# Patient Record
Sex: Female | Born: 1935 | Race: White | Hispanic: No | Smoking: Never smoker
Health system: Southern US, Community
[De-identification: ages and names within clinical notes are randomized; demographics above are authoritative.]

## PROBLEM LIST (undated history)

## (undated) DIAGNOSIS — K219 Gastro-esophageal reflux disease without esophagitis: Secondary | ICD-10-CM

## (undated) DIAGNOSIS — J189 Pneumonia, unspecified organism: Secondary | ICD-10-CM

## (undated) DIAGNOSIS — E7800 Pure hypercholesterolemia, unspecified: Secondary | ICD-10-CM

## (undated) DIAGNOSIS — D649 Anemia, unspecified: Secondary | ICD-10-CM

## (undated) DIAGNOSIS — M810 Age-related osteoporosis without current pathological fracture: Secondary | ICD-10-CM

## (undated) DIAGNOSIS — F32A Depression, unspecified: Secondary | ICD-10-CM

## (undated) DIAGNOSIS — Z9889 Other specified postprocedural states: Secondary | ICD-10-CM

## (undated) DIAGNOSIS — F329 Major depressive disorder, single episode, unspecified: Secondary | ICD-10-CM

## (undated) DIAGNOSIS — R112 Nausea with vomiting, unspecified: Secondary | ICD-10-CM

## (undated) DIAGNOSIS — M1990 Unspecified osteoarthritis, unspecified site: Secondary | ICD-10-CM

## (undated) DIAGNOSIS — I10 Essential (primary) hypertension: Secondary | ICD-10-CM

## (undated) HISTORY — PX: TONSILLECTOMY: SUR1361

## (undated) HISTORY — PX: ABDOMINAL HYSTERECTOMY: SHX81

---

## 1991-12-17 HISTORY — PX: BREAST SURGERY: SHX581

## 2000-12-16 DIAGNOSIS — R55 Syncope and collapse: Secondary | ICD-10-CM

## 2000-12-16 HISTORY — DX: Syncope and collapse: R55

## 2009-03-28 ENCOUNTER — Ambulatory Visit: Payer: Self-pay | Admitting: Unknown Physician Specialty

## 2009-06-11 ENCOUNTER — Ambulatory Visit: Payer: Self-pay | Admitting: Internal Medicine

## 2009-12-21 ENCOUNTER — Ambulatory Visit: Payer: Self-pay | Admitting: Internal Medicine

## 2010-12-27 ENCOUNTER — Ambulatory Visit: Payer: Self-pay | Admitting: Internal Medicine

## 2011-10-13 ENCOUNTER — Ambulatory Visit: Payer: Self-pay | Admitting: Family Medicine

## 2011-12-17 HISTORY — PX: CATARACT EXTRACTION, BILATERAL: SHX1313

## 2011-12-30 ENCOUNTER — Ambulatory Visit: Payer: Self-pay | Admitting: Internal Medicine

## 2012-09-01 ENCOUNTER — Encounter: Payer: Self-pay | Admitting: Rheumatology

## 2012-09-15 ENCOUNTER — Encounter: Payer: Self-pay | Admitting: Rheumatology

## 2012-09-16 ENCOUNTER — Ambulatory Visit: Payer: Self-pay | Admitting: Ophthalmology

## 2012-10-25 ENCOUNTER — Ambulatory Visit: Payer: Self-pay | Admitting: Internal Medicine

## 2012-11-04 ENCOUNTER — Ambulatory Visit: Payer: Self-pay | Admitting: Ophthalmology

## 2012-12-30 ENCOUNTER — Ambulatory Visit: Payer: Self-pay | Admitting: Internal Medicine

## 2013-02-11 ENCOUNTER — Ambulatory Visit: Payer: Self-pay | Admitting: Unknown Physician Specialty

## 2013-12-02 ENCOUNTER — Ambulatory Visit: Payer: Self-pay | Admitting: General Practice

## 2014-02-02 ENCOUNTER — Ambulatory Visit: Payer: Self-pay | Admitting: General Practice

## 2014-02-02 LAB — BASIC METABOLIC PANEL
ANION GAP: 8 (ref 7–16)
BUN: 12 mg/dL (ref 7–18)
Calcium, Total: 9.9 mg/dL (ref 8.5–10.1)
Chloride: 101 mmol/L (ref 98–107)
Co2: 25 mmol/L (ref 21–32)
Creatinine: 0.71 mg/dL (ref 0.60–1.30)
EGFR (African American): >60
GLUCOSE: 78 mg/dL (ref 65–99)
Osmolality: 267 (ref 275–301)
Potassium: 3.3 mmol/L — ABNORMAL LOW (ref 3.5–5.1)
Sodium: 134 mmol/L — ABNORMAL LOW (ref 136–145)

## 2014-02-02 LAB — CBC
HCT: 33.7 % — ABNORMAL LOW (ref 35.0–47.0)
HGB: 11.5 g/dL — ABNORMAL LOW (ref 12.0–16.0)
MCH: 29.1 pg (ref 26.0–34.0)
MCHC: 34.2 g/dL (ref 32.0–36.0)
MCV: 85 fL (ref 80–100)
Platelet: 307 10*3/uL (ref 150–440)
RBC: 3.95 10*6/uL (ref 3.80–5.20)
RDW: 14.1 % (ref 11.5–14.5)
WBC: 8.4 10*3/uL (ref 3.6–11.0)

## 2014-02-02 LAB — URINALYSIS, COMPLETE
BILIRUBIN, UR: NEGATIVE
BLOOD: NEGATIVE
Bacteria: NONE SEEN
Glucose,UR: NEGATIVE mg/dL (ref 0–75)
KETONE: NEGATIVE
Leukocyte Esterase: NEGATIVE
Nitrite: NEGATIVE
PH: 8 (ref 4.5–8.0)
PROTEIN: NEGATIVE
RBC,UR: NONE SEEN /HPF (ref 0–5)
SPECIFIC GRAVITY: 1.005 (ref 1.003–1.030)
Squamous Epithelial: <1
WBC UR: NONE SEEN /HPF (ref 0–5)

## 2014-02-02 LAB — PROTIME-INR
INR: 0.9
Prothrombin Time: 12.4 s

## 2014-02-02 LAB — SEDIMENTATION RATE: Erythrocyte Sed Rate: 33 mm/h — ABNORMAL HIGH

## 2014-02-02 LAB — APTT: ACTIVATED PTT: 32.1 s (ref 23.6–35.9)

## 2014-02-02 LAB — MRSA PCR SCREENING

## 2014-02-04 LAB — URINE CULTURE

## 2014-02-16 ENCOUNTER — Inpatient Hospital Stay: Payer: Self-pay | Admitting: General Practice

## 2014-02-16 HISTORY — PX: OTHER SURGICAL HISTORY: SHX169

## 2014-02-17 LAB — BASIC METABOLIC PANEL
Anion Gap: 4 — ABNORMAL LOW (ref 7–16)
BUN: 8 mg/dL (ref 7–18)
CREATININE: 0.66 mg/dL (ref 0.60–1.30)
Calcium, Total: 8.5 mg/dL (ref 8.5–10.1)
Chloride: 103 mmol/L (ref 98–107)
Co2: 28 mmol/L (ref 21–32)
EGFR (Non-African Amer.): >60
Glucose: 120 mg/dL — ABNORMAL HIGH (ref 65–99)
Osmolality: 270 (ref 275–301)
Potassium: 3.7 mmol/L (ref 3.5–5.1)
Sodium: 135 mmol/L — ABNORMAL LOW (ref 136–145)

## 2014-02-17 LAB — PLATELET COUNT: PLATELETS: 232 10*3/uL (ref 150–440)

## 2014-02-17 LAB — HEMOGLOBIN: HGB: 9.2 g/dL — AB (ref 12.0–16.0)

## 2014-02-18 ENCOUNTER — Encounter: Payer: Self-pay | Admitting: Internal Medicine

## 2014-02-18 LAB — BASIC METABOLIC PANEL
Anion Gap: 3 — ABNORMAL LOW (ref 7–16)
BUN: 10 mg/dL (ref 7–18)
Calcium, Total: 8.2 mg/dL — ABNORMAL LOW (ref 8.5–10.1)
Chloride: 106 mmol/L (ref 98–107)
Co2: 26 mmol/L (ref 21–32)
Creatinine: 0.5 mg/dL — ABNORMAL LOW (ref 0.60–1.30)
EGFR (African American): >60
Glucose: 130 mg/dL — ABNORMAL HIGH (ref 65–99)
Osmolality: 271 (ref 275–301)
Potassium: 4 mmol/L (ref 3.5–5.1)
SODIUM: 135 mmol/L — AB (ref 136–145)

## 2014-02-18 LAB — PLATELET COUNT: Platelet: 223 10*3/uL (ref 150–440)

## 2014-02-18 LAB — HEMOGLOBIN: HGB: 8.2 g/dL — ABNORMAL LOW (ref 12.0–16.0)

## 2014-02-18 LAB — PATHOLOGY REPORT

## 2014-02-19 LAB — HEMOGLOBIN: HGB: 8.1 g/dL — ABNORMAL LOW (ref 12.0–16.0)

## 2014-05-04 DIAGNOSIS — Z96641 Presence of right artificial hip joint: Secondary | ICD-10-CM | POA: Insufficient documentation

## 2014-06-06 DIAGNOSIS — I10 Essential (primary) hypertension: Secondary | ICD-10-CM | POA: Insufficient documentation

## 2014-06-06 DIAGNOSIS — M8580 Other specified disorders of bone density and structure, unspecified site: Secondary | ICD-10-CM | POA: Insufficient documentation

## 2014-09-22 ENCOUNTER — Ambulatory Visit: Payer: Self-pay

## 2014-09-22 LAB — URINALYSIS, COMPLETE
BILIRUBIN, UR: NEGATIVE
Bacteria: NEGATIVE
Blood: NEGATIVE
Glucose,UR: NEGATIVE
Ketone: NEGATIVE
Leukocyte Esterase: NEGATIVE
Nitrite: NEGATIVE
PH: 7.5 (ref 5.0–8.0)
PROTEIN: NEGATIVE
RBC,UR: NONE SEEN /HPF (ref 0–5)
Specific Gravity: 1.015 (ref 1.000–1.030)
WBC UR: NONE SEEN /HPF (ref 0–5)

## 2014-09-22 LAB — COMPREHENSIVE METABOLIC PANEL
ALBUMIN: 3.9 g/dL (ref 3.4–5.0)
ALK PHOS: 77 U/L
ALT: 26 U/L
Anion Gap: 11 (ref 7–16)
BUN: 13 mg/dL (ref 7–18)
Bilirubin,Total: 0.7 mg/dL (ref 0.2–1.0)
CHLORIDE: 95 mmol/L — AB (ref 98–107)
CREATININE: 0.71 mg/dL (ref 0.60–1.30)
Calcium, Total: 9.7 mg/dL (ref 8.5–10.1)
Co2: 24 mmol/L (ref 21–32)
EGFR (African American): >60
EGFR (Non-African Amer.): >60
Glucose: 91 mg/dL (ref 65–99)
Osmolality: 260 (ref 275–301)
POTASSIUM: 3 mmol/L — AB (ref 3.5–5.1)
SGOT(AST): 31 U/L (ref 15–37)
Sodium: 130 mmol/L — ABNORMAL LOW (ref 136–145)
TOTAL PROTEIN: 7.5 g/dL (ref 6.4–8.2)

## 2014-09-22 LAB — CBC WITH DIFFERENTIAL/PLATELET
BASOS PCT: 1 %
Basophil #: 0.1 10*3/uL (ref 0.0–0.1)
Eosinophil #: 0.2 10*3/uL (ref 0.0–0.7)
Eosinophil %: 1.8 %
HCT: 35.9 % (ref 35.0–47.0)
HGB: 12.1 g/dL (ref 12.0–16.0)
LYMPHS ABS: 2.1 10*3/uL (ref 1.0–3.6)
Lymphocyte %: 23.4 %
MCH: 28.6 pg (ref 26.0–34.0)
MCHC: 33.6 g/dL (ref 32.0–36.0)
MCV: 85 fL (ref 80–100)
Monocyte #: 0.7 x10 3/mm (ref 0.2–0.9)
Monocyte %: 8 %
Neutrophil #: 5.9 10*3/uL (ref 1.4–6.5)
Neutrophil %: 65.8 %
PLATELETS: 298 10*3/uL (ref 150–440)
RBC: 4.22 10*6/uL (ref 3.80–5.20)
RDW: 14.3 % (ref 11.5–14.5)
WBC: 8.9 10*3/uL (ref 3.6–11.0)

## 2014-09-22 LAB — LIPASE, BLOOD: Lipase: 127 U/L (ref 73–393)

## 2014-09-22 LAB — AMYLASE: Amylase: 38 U/L (ref 25–115)

## 2014-12-12 DIAGNOSIS — F419 Anxiety disorder, unspecified: Secondary | ICD-10-CM | POA: Insufficient documentation

## 2015-04-08 NOTE — Discharge Summary (Signed)
PATIENT NAME:  Susan Ayala, Susan Ayala MR#:  [id number] DATE OF BIRTH:  11-25-1934  DATE OF ADMISSION:  02/16/2014 DATE OF DISCHARGE:  02/19/2014   DICTATING FOR: Jeneen Rinks P. Holley Bouche., MD  ADMITTING DIAGNOSIS: Degenerative arthrosis of right hip.   DISCHARGE DIAGNOSIS:  Degenerative arthrosis of right hip.   HISTORY OF PRESENT ILLNESS: The patient is a 81 year old who has been followed at [facility] for progression of right hip and groin pain. She had reported no apparent trauma or aggravating event. She had reported crepitus with range of motion of the right hip and had also noticed some decrease in her range of motion. At the time of surgery, she had gone to using a cane for ambulation due to the severity of pain. The patient had also received intra-articular cortisone injections to the hip in the past without any significant improvement in her symptoms. She states that she has not seen any significant improvement in her condition despite the use of nonsteroidal anti-inflammatory medications as well as activity modification. She stated that the right hip and groin pain had progressed to the point that it was significantly interfering with her activities of daily living. X-rays taken in [facility] showed severe degenerative changes to the right hip with collapse of the superior aspect of the femoral head. After discussion of the risks and benefits of surgical intervention, the patient expressed her understanding of the risks and benefits and agreed for plans for surgical intervention.   PROCEDURE: Right total hip arthroplasty.   ANESTHESIA: Spinal.   IMPLANTS UTILIZED: DePuy 13.5 mm small stature AML femoral stem, 54 mm outer diameter Pinnacle Gription sector acetabular component, +4 mm neutral Pinnacle Marathon polyethylene liner, 36 mm outer diameter M-Spec femoral head with a +1.5 mm neck length, 6.5 x 25 mm Pinnacle cancellous bone screw and a 6.5 x 15 mm Pinnacle cancellous bone  screw.   HOSPITAL COURSE: The patient tolerated the procedure very well. She had no complications. She was then taken to the PACU, where she was stabilized and then transferred to the orthopedic floor. The patient began receiving anticoagulation therapy of Lovenox 30 mg subcutaneous q.12 hours per anesthesia and pharmacy protocol. She was fitted with TED stockings bilaterally. These were allowed to be removed 1 hour per 8-hour shift. The patient was also fitted with AVI compression foot pumps bilaterally set at 80 mmHg. Her calves have been nontender. There has been no evidence of any DVTs. Negative Homans sign. Heels were elevated off the bed using rolled towels.   The patient has denied any chest pain or shortness of breath. She did have some nausea initially. Her medication was then changed to [place]o. Vital signs have been stable. She has been afebrile. Hemodynamically, she was stable, and no transfusions were given.   Physical therapy was initiated on day 1 for gait training and transfers. She has done very well. Upon being discharged, was ambulating greater than 200 feet, was independent with bed to chair transfers. Occupational therapy was also initiated on day 1 for ADLs and assistive devices.   The patient's IV, Foley and Hemovac were discontinued on day 2 along with a dressing change. The wound was free of any drainage or signs of infection. She did have a small skin tear due to the tape that was placed on at the time of surgery, and this was covered.   The patient is being discharged to a skilled nursing facility in improved stable condition.   DISCHARGE INSTRUCTIONS:  1. She  is to continue weightbearing as tolerated.  2. She has gone over the posterior hip precautions.  3. She is to elevate the lower extremity.  4. Continue with TED stockings. These are allowed to be removed 1 hour per 8-hour shift.  5. She is to continue with the incentive spirometer q.1 hour while awake. Encourage the  patient to do cough and deep breathing q.2 hours while awake.  6. She is placed on a regular diet.  7. The dressing will need to be changed as needed. She is not to get the dressing wet until the staples are removed. The staples will be removed on March 18, followed by the application of benzoin and Steri-Strips.  8. She has a followup appointment in [facility] on April 21st at 2:00. She is to call the clinic sooner if any problems.   DRUG ALLERGIES: CODEINE, ADHESIVES, BAND-AIDS,    MEDICATIONS:  1. Calcium carbonate 500 mg b.i.d. 2. Calcium citrate 1 tablet b.i.d.  3. Vitamin D3 1000 units daily.  4. Senokot-S 1 tablet b.i.d. 5. Hydrochlorothiazide 25 mg at bedtime. 6. Mevacor 40 mg at bedtime.  7. One multivitamin tablet per day.  8. Pantoprazole 40 mg b.i.d. 9. Zoloft 100 mg daily. 10. Lovenox 30 mg subcutaneous q.12 hours for 14 days, then discontinue and begin taking one 81 mg enteric-coated aspirin per day unless contraindicated.  11. Tylenol ES 500 to 1000 mg q.4-6 hours p.r.n. for pain and fevers of 100.4 or greater. 12. Mylanta DS 30 mL q.6 hours.  13. Dulcolax suppositories 10 mg rectally daily p.r.n. for constipation if no results with Milk of Magnesia.  14. Claritin 10 mg daily p.r.n. 15. Milk of Magnesia 30 mL b.i.d. p.r.n. 16. Nucynta 50 to 100 mg q.4-6 hours p.r.n. for pain. 17. Ultram 50 to 100 mg q.4-6 hours p.r.n. for pain.   PAST MEDICAL HISTORY:  1. Varicella. 2. Fibrocystic breast disease.  3. Depression.  4. Arthritis.  5. Hyperlipidemia.  6. Osteoporosis.  7. Facial skin cancer.   ____________________________ Vance Peper, PA jrw:lb D: 02/19/2014 08:01:58 ET T: 02/19/2014 08:26:03 ET JOB#: [id number]  cc: Vance Peper, PA, <Dictator> JON WOLFE PA ELECTRONICALLY SIGNED 03/03/2014 18:32

## 2015-04-08 NOTE — Op Note (Signed)
PATIENT NAME:  Susan Ayala, Susan Ayala MR#:  [id number] DATE OF BIRTH:  February 02, 1934  DATE OF PROCEDURE:  02/16/2014  PREOPERATIVE DIAGNOSIS: Degenerative arthrosis of the right hip.   POSTOPERATIVE DIAGNOSIS: Degenerative arthrosis of the right hip.   PROCEDURE PERFORMED: Right total hip arthroplasty.   SURGEON: Laurice Record. Hooten, M.D.   ASSISTANT: Vance Peper, PA (required to maintain retraction throughout the procedure).   ANESTHESIA: Spinal.   ESTIMATED BLOOD LOSS: 50 mL.   FLUIDS REPLACED: 1800 mL of crystalloid.   DRAINS: Two medium drains to Hemovac reservoir.   IMPLANTS UTILIZED: DePuy 13.5 mm small stature AML femoral stem, 54 mm outer diameter Pinnacle Gription Sector acetabular component, +4 mm neutral Pinnacle Marathon polyethylene liner, 36 mm outer diameter M-SPEC femoral head with a +1.5 mm neck length, 6.5 x 25 mm Pinnacle cancellous bone screw, 6.5 x 15 mm Pinnacle cancellous bone screw.   INDICATIONS FOR SURGERY: The patient is a 81 year old female who has been seen for complaints of progressive right hip and groin pain. X-rays demonstrated severe degenerative changes with essentially collapse of the femoral head. After discussion of the risks and benefits of surgical intervention, the patient expressed understanding of the risks and benefits and agreed with plans for surgical intervention.   PROCEDURE IN DETAIL: The patient was brought to the operating room and, after adequate spinal anesthesia was achieved, the patient was placed in the left lateral decubitus position. Axillary roll was placed and all bony prominences were well padded. The patient's right hip and leg were cleaned and prepped with alcohol and DuraPrep and draped in the usual sterile fashion. A "timeout" was performed as per usual protocol.   A lateral curvilinear incision was made gently curving towards the posterior superior iliac spine. IT band was incised in line with the skin incision, and the fibers of the  gluteus maximus were split in line. Piriformis tendon was identified, skeletonized, and incised at its insertion at the proximal femur and reflected posteriorly.   In a similar fashion, the short external rotators were incised and reflected posteriorly. T-type posterior capsulotomy was performed. Moderate joint effusion was evacuated. Significant synovitis was encountered and synovial tissue was debrided. Prior to dislocation of the femoral head, a threaded Steinmann pin was inserted through a separate stab incision into the pelvis superior to the acetabulum and bent in the form of a stylus so as to assess limb length and hip offset throughout the procedure. The femoral head was then dislocated posteriorly.   Severe erosive changes were noted with full-thickness loss of articular cartilage. The femoral neck cut was performed using an oscillating saw. The anterior capsule was elevated off of the femoral neck. Inspection of the acetabulum also demonstrated significant degenerative changes with notable thinning along the superior and posterior margins. A remnant of the labrum was excised. The acetabulum was then reamed in a sequential fashion up to a 53 mm diameter. Good punctate bleeding bone was encountered.   A 54 mm outer diameter Pinnacle Gription Sector acetabular component was positioned and impacted into place. Fair to good scratch fit was appreciated. It was elected to provide additional stabilization with insertion of 2 dome screws. A 25 x 6.5 mm cancellous screw and a 15 x 6.5 mm cancellous screw were inserted with good bony purchase appreciated. A+ 4 neutral polyethylene trial was inserted and attention was directed to the proximal femur. Pilot hole for reaming of the proximal femoral canal was created using a high-speed bur. Proximal femoral canal was reamed in  a sequential fashion up to a 13 mm diameter. This allowed for approximately 5.5 cm scratch fit.   The proximal femur was prepared using a  13.5 mm aggressive side-biting reamer. Serial broaches were inserted up to a 13.5 mm small stature L-broach. The calcar region was planed and trial reduction was performed with a 36 mm hip ball with a +1.5 mm neck length. The improved hip offset and improved equalization of limb lengths was appreciated. Excellent stability was noted both anteriorly and posteriorly.   The femoral head was then dislocated posteriorly. Polyethylene trial was removed. The acetabular shell was irrigated with normal saline with antibiotic solution and then suctioned dry. A+ 4 mm neutral Pinnacle Marathon polyethylene liner was positioned and impacted into place. Next, a 13.5 mm small-stature AML femoral component was positioned and impacted into place. Good scratch fit was appreciated.   Trial reduction was again performed with a 36 mm hip ball with a +1.5 mm neck length. Again, improved hip offset and good equalization of limb lengths was appreciated. Excellent stability was noted both anteriorly and posteriorly. The hip was dislocated posteriorly and the Morse tape  was cleaned and dried. A 36 mm M-SPEC femoral head with a + 1.5 mm neck length was placed on the trunnion and impacted into place. The hip was reduced and placed through a range of motion. Excellent stability was appreciated and good equalization of limb lengths are noted.   The wound was irrigated with copious amounts of normal saline with antibiotic solution using pulsatile lavage and then suctioned dry. Good hemostasis was appreciated. The T-type posterior capsulotomy was repaired using #5 Ethibond. The piriformis tendon was reapproximated on the surface of the gluteus medius tendon using #5 Ethibond. Two medium drains were placed in the wound bed and brought out through a separate stab incision to be attached to a Hemovac reservoir. The IT band was repaired using interrupted sutures of #1 Vicryl. The subcutaneous tissue was approximated in layers using first #0  Vicryl followed by #2-0 Vicryl. Then 0.25% Marcaine with epinephrine was injected along the incision site. Skin was then closed with skin staples. A sterile dressing was applied.   The patient tolerated the procedure well. She was transported to the recovery room in stable condition.  ____________________________ Laurice Record. Holley Bouche., MD jph:np D: 02/16/2014 10:49:29 ET T: 02/16/2014 18:58:19 ET JOB#: [id number]  cc: Jeneen Rinks P. Holley Bouche., MD, <Dictator> JAMES P Holley Bouche MD ELECTRONICALLY SIGNED 02/18/2014 13:05

## 2015-06-14 DIAGNOSIS — J302 Other seasonal allergic rhinitis: Secondary | ICD-10-CM | POA: Insufficient documentation

## 2015-06-14 DIAGNOSIS — Z79899 Other long term (current) drug therapy: Secondary | ICD-10-CM | POA: Insufficient documentation

## 2016-05-24 ENCOUNTER — Other Ambulatory Visit: Payer: Self-pay | Admitting: Orthopedic Surgery

## 2016-05-24 DIAGNOSIS — M25561 Pain in right knee: Principal | ICD-10-CM

## 2016-05-24 DIAGNOSIS — G8929 Other chronic pain: Secondary | ICD-10-CM

## 2016-06-13 ENCOUNTER — Ambulatory Visit: Admission: RE | Admit: 2016-06-13 | Payer: Medicare Other | Source: Ambulatory Visit

## 2016-06-19 ENCOUNTER — Ambulatory Visit
Admission: RE | Admit: 2016-06-19 | Discharge: 2016-06-19 | Disposition: A | Discharge status: Home / Self Care | Payer: Medicare Other | Source: Ambulatory Visit | Attending: Orthopedic Surgery | Admitting: Orthopedic Surgery

## 2016-06-19 DIAGNOSIS — X58XXXA Exposure to other specified factors, initial encounter: Secondary | ICD-10-CM | POA: Insufficient documentation

## 2016-06-19 DIAGNOSIS — M179 Osteoarthritis of knee, unspecified: Secondary | ICD-10-CM | POA: Insufficient documentation

## 2016-06-19 DIAGNOSIS — M25561 Pain in right knee: Secondary | ICD-10-CM | POA: Diagnosis present

## 2016-06-19 DIAGNOSIS — S83281A Other tear of lateral meniscus, current injury, right knee, initial encounter: Secondary | ICD-10-CM | POA: Diagnosis not present

## 2016-06-19 DIAGNOSIS — G8929 Other chronic pain: Secondary | ICD-10-CM

## 2016-06-19 DIAGNOSIS — M659 Synovitis and tenosynovitis, unspecified: Secondary | ICD-10-CM | POA: Insufficient documentation

## 2016-06-24 IMAGING — CT CT ABD-PELV W/O CM
3 of 4 series · 9 of 46 positions shown, 16 images · non-contrast
Comparison: None.

CLINICAL DATA: Right lower quadrant pain.  Initial evaluation.

EXAM:
CT ABDOMEN AND PELVIS WITHOUT CONTRAST
TECHNIQUE: Multidetector CT imaging of the abdomen and pelvis was performed
following the standard protocol without IV contrast.

[Series 4: lung · axial · 0.80mm/px · z∈[-481,-406]mm · 5 of 23 slices shown, 10 images]
[im 4/23  soft-tissue]
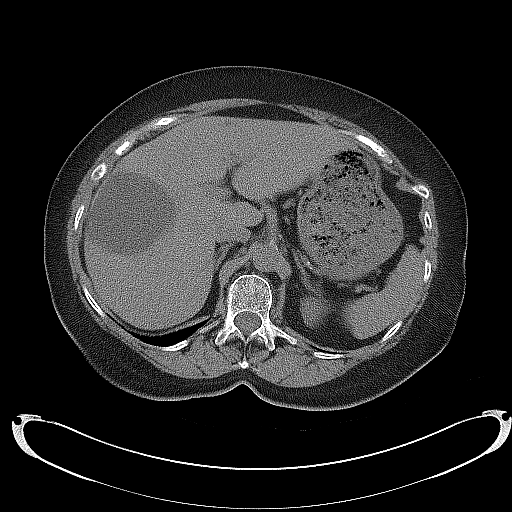
[im 4/23  bone]
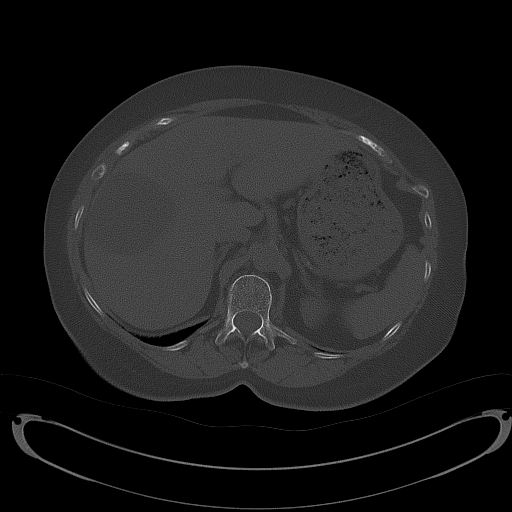
[im 8/23  soft-tissue]
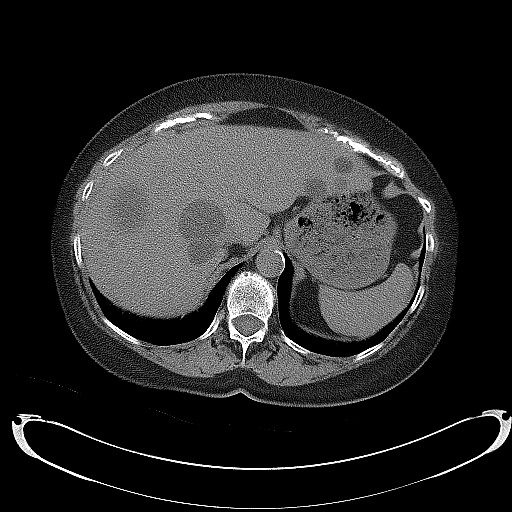
[im 8/23  lung]
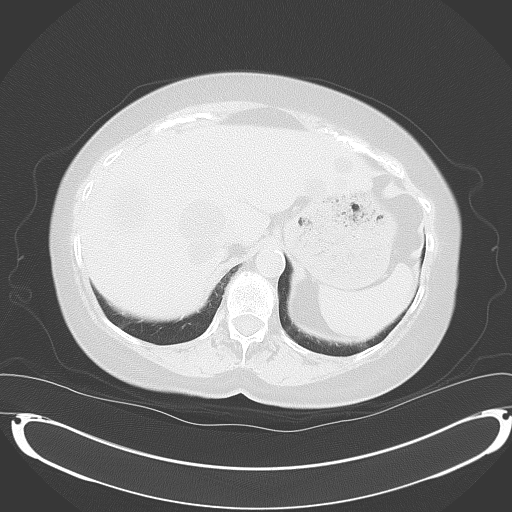
[im 12/23  soft-tissue]
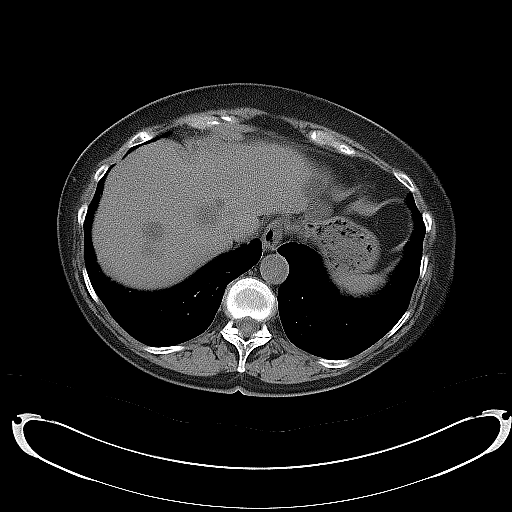
[im 12/23  lung]
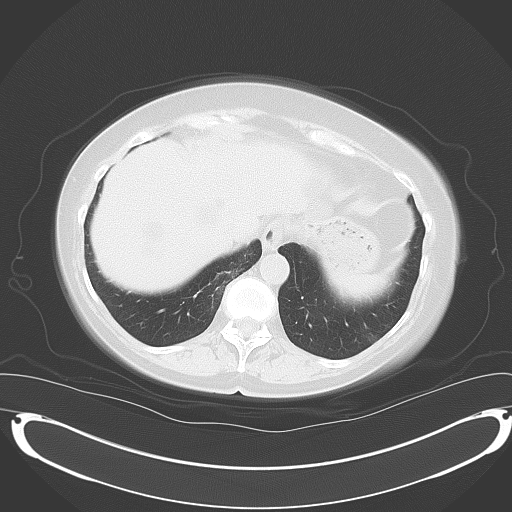
[im 15/23  soft-tissue]
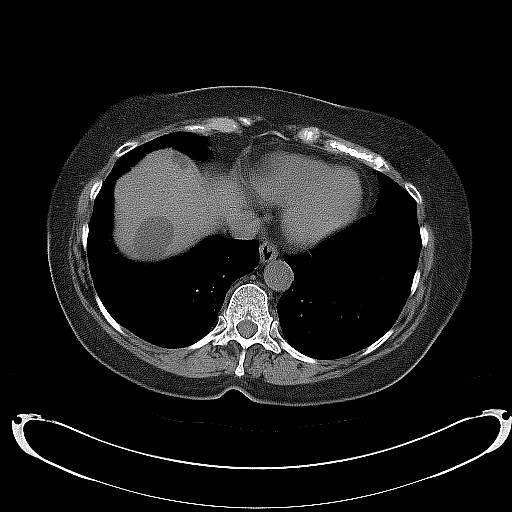
[im 15/23  lung]
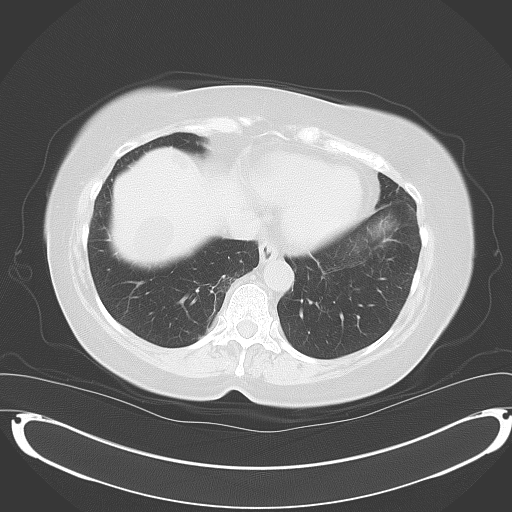
[im 19/23  soft-tissue]
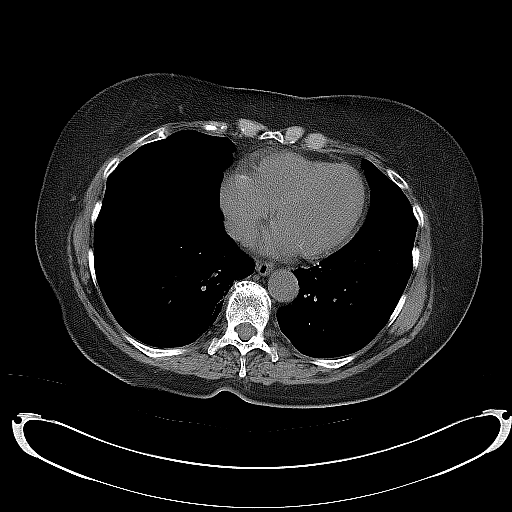
[im 19/23  lung]
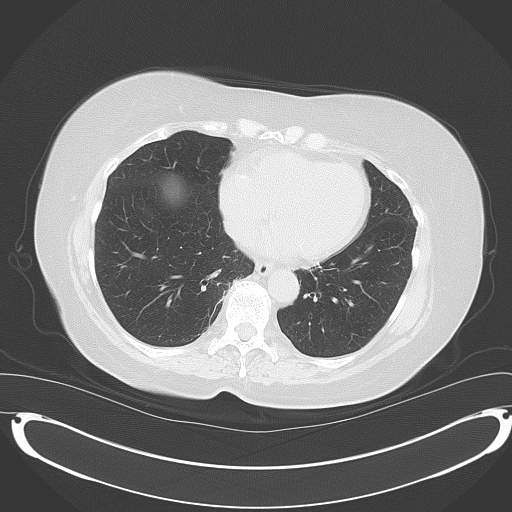

[Series 5: coronal · coronal · 0.88mm/px · 3 of 134 slices shown, 4 images]
[im 45/134  soft-tissue]
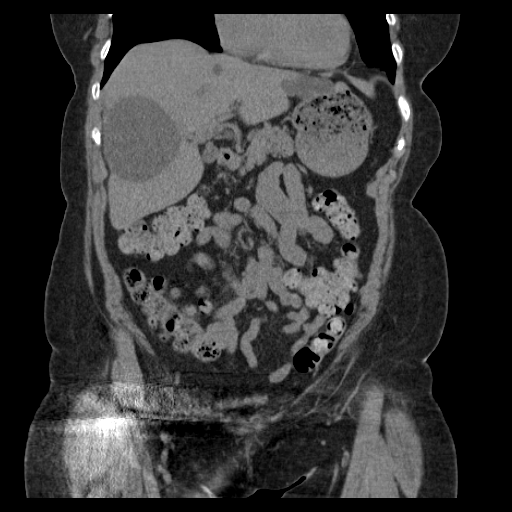
[im 60/134  soft-tissue]
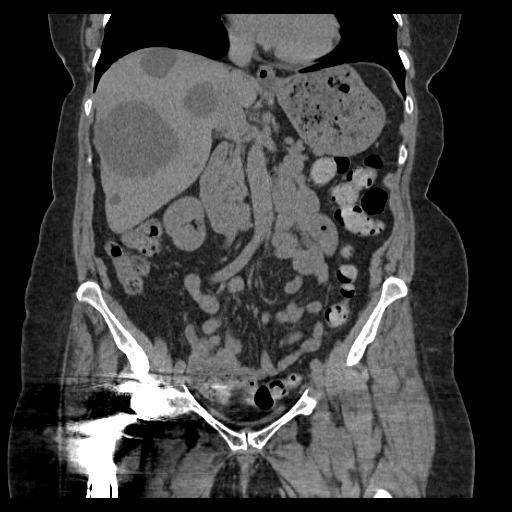
[im 60/134  bone]
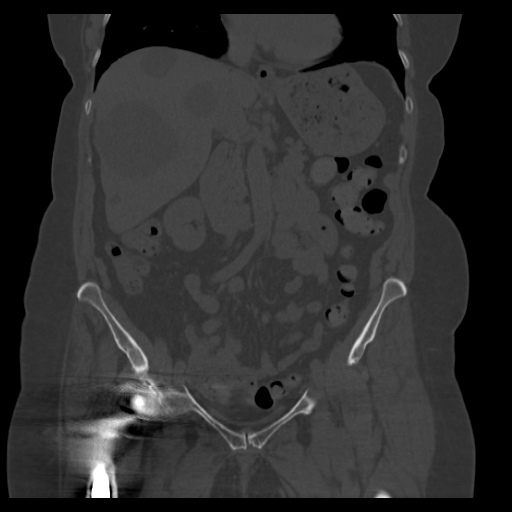
[im 74/134  soft-tissue]
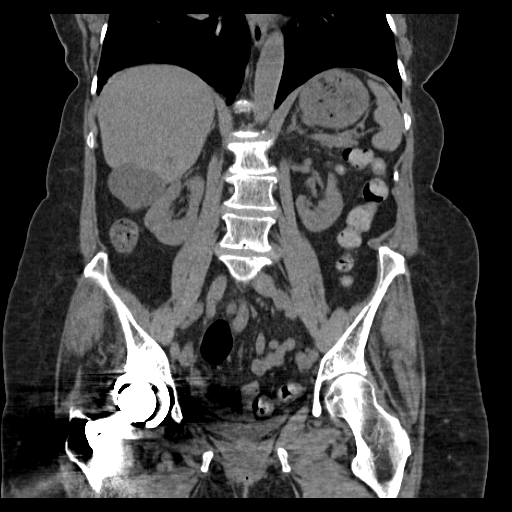

[Series 6: sagittal · sagittal · 0.59mm/px · 1 of 196 slices shown, 2 images]
[im 66/196  soft-tissue]
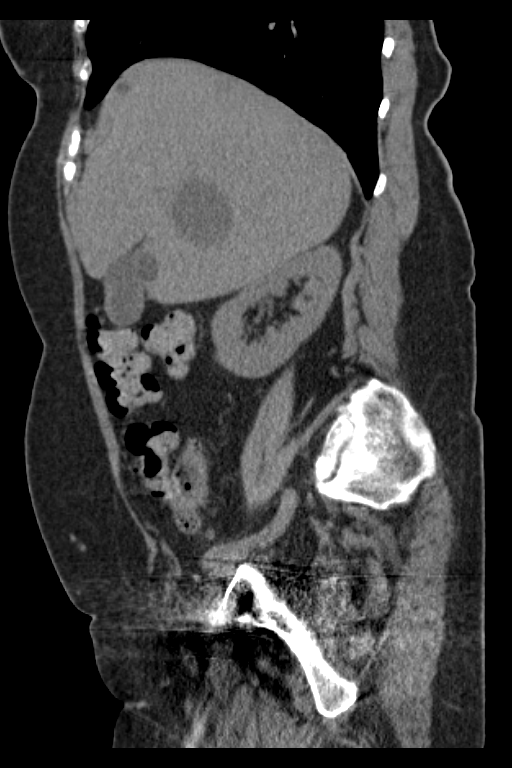
[im 66/196  bone]
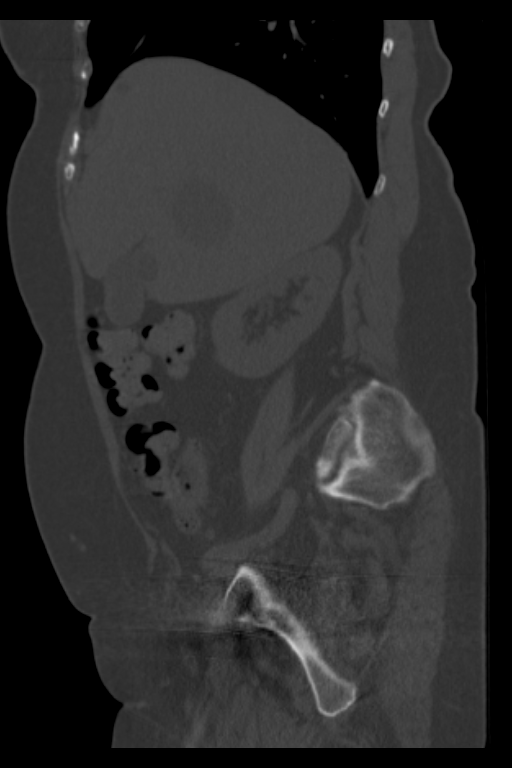

[9 of 46 positions shown; findings below may reference images not displayed]

FINDINGS: Innumerable hepatic lucencies are noted consistent simple cysts. The
largest is in the right lobe of liver measures 8.3 cm in maximum
diameter. Spleen normal. Pancreas normal. No biliary distention. The
gallbladder is nondistended.

Adrenals are unremarkable. No focal renal abnormality. No
obstructing ureteral stone. The bladder nondistended.

No significant adenopathy.  Abdominal aorta normal in caliber.

Appendix not visualized. Stool is noted throughout the colon. No
free air. No mesenteric mass. Bilateral inguinal hernias. Small
umbilical hernia.

Coronary artery disease. Heart size normal. No focal pulmonary
infiltrates. Degenerative changes thoracolumbar spine. Right hip
replacement.
IMPRESSION: 1. Innumerable hepatic lucencies consistent with simple cysts.
2. No focal acute abnormality. Right lower quadrant unremarkable. No
inflammatory changes are noted. Appendix not identified.
3. Coronary artery disease.

## 2016-08-15 ENCOUNTER — Encounter
Admission: RE | Admit: 2016-08-15 | Discharge: 2016-08-15 | Disposition: A | Discharge status: Home / Self Care | Payer: Medicare Other | Source: Ambulatory Visit | Attending: Orthopedic Surgery | Admitting: Orthopedic Surgery

## 2016-08-15 DIAGNOSIS — Z01810 Encounter for preprocedural cardiovascular examination: Secondary | ICD-10-CM | POA: Diagnosis not present

## 2016-08-15 DIAGNOSIS — I10 Essential (primary) hypertension: Secondary | ICD-10-CM

## 2016-08-15 DIAGNOSIS — Z01812 Encounter for preprocedural laboratory examination: Secondary | ICD-10-CM | POA: Insufficient documentation

## 2016-08-15 HISTORY — DX: Major depressive disorder, single episode, unspecified: F32.9

## 2016-08-15 HISTORY — DX: Pure hypercholesterolemia, unspecified: E78.00

## 2016-08-15 HISTORY — DX: Age-related osteoporosis without current pathological fracture: M81.0

## 2016-08-15 HISTORY — DX: Nausea with vomiting, unspecified: R11.2

## 2016-08-15 HISTORY — DX: Other specified postprocedural states: Z98.890

## 2016-08-15 HISTORY — DX: Essential (primary) hypertension: I10

## 2016-08-15 HISTORY — DX: Unspecified osteoarthritis, unspecified site: M19.90

## 2016-08-15 HISTORY — DX: Depression, unspecified: F32.A

## 2016-08-15 LAB — BASIC METABOLIC PANEL
ANION GAP: 7 (ref 5–15)
BUN: 16 mg/dL (ref 6–20)
CHLORIDE: 102 mmol/L (ref 101–111)
CO2: 28 mmol/L (ref 22–32)
CREATININE: 0.58 mg/dL (ref 0.44–1.00)
Calcium: 10 mg/dL (ref 8.9–10.3)
GFR calc non Af Amer: >60 mL/min (ref >60)
GLUCOSE: 100 mg/dL — AB (ref 65–99)
POTASSIUM: 3.8 mmol/L (ref 3.5–5.1)
SODIUM: 137 mmol/L (ref 135–145)

## 2016-08-15 NOTE — Patient Instructions (Signed)
  Your procedure is scheduled on: Monday Sept. 11 , 2017. Report to Same Day Surgery. To find out your arrival time please call [phone] between 1PM - 3PM on Friday Sept. 8, 2017.  Remember: Instructions that are not followed completely may result in serious medical risk, up to and including death, or upon the discretion of your surgeon and anesthesiologist your surgery may need to be rescheduled.    _x___ 1. Do not eat food or drink liquids after midnight. No gum chewing or hard candies.     _x___ 2. No Alcohol for 24 hours before or after surgery.   ____ 3. Bring all medications with you on the day of surgery if instructed.    __x__ 4. Notify your doctor if there is any change in your medical condition     (cold, fever, infections).    _____ 5. No smoking 24 hours prior to surgery.     Do not wear jewelry, make-up, hairpins, clips or nail polish.  Do not wear lotions, powders, or perfumes.   Do not shave 48 hours prior to surgery. Men may shave face and neck.  Do not bring valuables to the hospital.    [facility] is not responsible for any belongings or valuables.               Contacts, dentures or bridgework may not be worn into surgery.  Leave your suitcase in the car. After surgery it may be brought to your room.  For patients admitted to the hospital, discharge time is determined by your treatment team.   Patients discharged the day of surgery will not be allowed to drive home.    Please read over the following fact sheets that you were given:   [facility] Preparing for Surgery  __x_ Take these medicines the morning of surgery with A SIP OF WATER:    1. sertraline (ZOLOFT)     ____ Fleet Enema (as directed)   _x___ Use CHG Soap as directed on instruction sheet  ____ Use inhalers on the day of surgery and bring to hospital day of surgery  ____ Stop metformin 2 days prior to surgery    ____ Take 1/2 of usual insulin dose the night before surgery and none on  the morning of surgery.   _x___ Stop aspirin 7 days prior to surgery.  _x___ Stop Anti-inflammatories such as Advil, Aleve, Ibuprofen, Motrin, Naproxen, Naprosyn, Goodies powders or aspirin products. OK to take Tylenol.   ____ Stop supplements until after surgery.    ____ Bring C-Pap to the hospital.

## 2016-08-26 ENCOUNTER — Ambulatory Visit: Payer: Medicare Other | Admitting: Certified Registered Nurse Anesthetist

## 2016-08-26 ENCOUNTER — Encounter
Admission: RE | Disposition: A | Discharge status: Home / Self Care | Payer: Self-pay | Source: Ambulatory Visit | Attending: Orthopedic Surgery

## 2016-08-26 ENCOUNTER — Ambulatory Visit
Admission: RE | Admit: 2016-08-26 | Discharge: 2016-08-26 | Disposition: A | Discharge status: Home / Self Care | Payer: Medicare Other | Source: Ambulatory Visit | Attending: Orthopedic Surgery | Admitting: Orthopedic Surgery

## 2016-08-26 DIAGNOSIS — Z800 Family history of malignant neoplasm of digestive organs: Secondary | ICD-10-CM | POA: Insufficient documentation

## 2016-08-26 DIAGNOSIS — M2391 Unspecified internal derangement of right knee: Secondary | ICD-10-CM | POA: Diagnosis present

## 2016-08-26 DIAGNOSIS — Z801 Family history of malignant neoplasm of trachea, bronchus and lung: Secondary | ICD-10-CM | POA: Insufficient documentation

## 2016-08-26 DIAGNOSIS — Z8261 Family history of arthritis: Secondary | ICD-10-CM | POA: Diagnosis not present

## 2016-08-26 DIAGNOSIS — M94261 Chondromalacia, right knee: Secondary | ICD-10-CM | POA: Insufficient documentation

## 2016-08-26 DIAGNOSIS — M1990 Unspecified osteoarthritis, unspecified site: Secondary | ICD-10-CM | POA: Insufficient documentation

## 2016-08-26 DIAGNOSIS — N6019 Diffuse cystic mastopathy of unspecified breast: Secondary | ICD-10-CM | POA: Diagnosis not present

## 2016-08-26 DIAGNOSIS — Z885 Allergy status to narcotic agent status: Secondary | ICD-10-CM | POA: Insufficient documentation

## 2016-08-26 DIAGNOSIS — I10 Essential (primary) hypertension: Secondary | ICD-10-CM | POA: Insufficient documentation

## 2016-08-26 DIAGNOSIS — Z9842 Cataract extraction status, left eye: Secondary | ICD-10-CM | POA: Diagnosis not present

## 2016-08-26 DIAGNOSIS — L80 Vitiligo: Secondary | ICD-10-CM | POA: Diagnosis not present

## 2016-08-26 DIAGNOSIS — Z9841 Cataract extraction status, right eye: Secondary | ICD-10-CM | POA: Diagnosis not present

## 2016-08-26 DIAGNOSIS — Z7982 Long term (current) use of aspirin: Secondary | ICD-10-CM | POA: Diagnosis not present

## 2016-08-26 DIAGNOSIS — Z803 Family history of malignant neoplasm of breast: Secondary | ICD-10-CM | POA: Diagnosis not present

## 2016-08-26 DIAGNOSIS — M23241 Derangement of anterior horn of lateral meniscus due to old tear or injury, right knee: Secondary | ICD-10-CM | POA: Diagnosis not present

## 2016-08-26 DIAGNOSIS — Z90710 Acquired absence of both cervix and uterus: Secondary | ICD-10-CM | POA: Diagnosis not present

## 2016-08-26 DIAGNOSIS — Z85828 Personal history of other malignant neoplasm of skin: Secondary | ICD-10-CM | POA: Insufficient documentation

## 2016-08-26 DIAGNOSIS — Z833 Family history of diabetes mellitus: Secondary | ICD-10-CM | POA: Insufficient documentation

## 2016-08-26 DIAGNOSIS — Z91048 Other nonmedicinal substance allergy status: Secondary | ICD-10-CM | POA: Insufficient documentation

## 2016-08-26 DIAGNOSIS — M23211 Derangement of anterior horn of medial meniscus due to old tear or injury, right knee: Secondary | ICD-10-CM | POA: Diagnosis not present

## 2016-08-26 DIAGNOSIS — Z888 Allergy status to other drugs, medicaments and biological substances status: Secondary | ICD-10-CM | POA: Insufficient documentation

## 2016-08-26 DIAGNOSIS — Z79899 Other long term (current) drug therapy: Secondary | ICD-10-CM | POA: Insufficient documentation

## 2016-08-26 DIAGNOSIS — Z86010 Personal history of colonic polyps: Secondary | ICD-10-CM | POA: Diagnosis not present

## 2016-08-26 DIAGNOSIS — M23251 Derangement of posterior horn of lateral meniscus due to old tear or injury, right knee: Secondary | ICD-10-CM | POA: Diagnosis not present

## 2016-08-26 DIAGNOSIS — M8580 Other specified disorders of bone density and structure, unspecified site: Secondary | ICD-10-CM | POA: Insufficient documentation

## 2016-08-26 DIAGNOSIS — E785 Hyperlipidemia, unspecified: Secondary | ICD-10-CM | POA: Diagnosis not present

## 2016-08-26 HISTORY — PX: KNEE ARTHROSCOPY: SHX127

## 2016-08-26 SURGERY — ARTHROSCOPY, KNEE
Anesthesia: General | Site: Knee | Laterality: Right | Wound class: Clean

## 2016-08-26 MED ORDER — ONDANSETRON HCL 4 MG/2ML IJ SOLN
INTRAMUSCULAR | Status: DC | PRN
  Administered 2016-08-26: 4 mg via INTRAVENOUS

## 2016-08-26 MED ORDER — OXYCODONE HCL 5 MG PO TABS: 5.0000 mg | ORAL_TABLET | Freq: Once | ORAL | Status: DC | PRN

## 2016-08-26 MED ORDER — OXYCODONE HCL 5 MG/5ML PO SOLN: 5.0000 mg | Freq: Once | ORAL | Status: DC | PRN

## 2016-08-26 MED ORDER — BUPIVACAINE HCL 0.25 % IJ SOLN
INTRAMUSCULAR | Status: DC | PRN
  Administered 2016-08-26: 26 mL via INTRA_ARTICULAR

## 2016-08-26 MED ORDER — LIDOCAINE HCL (CARDIAC) 20 MG/ML IV SOLN
INTRAVENOUS | Status: DC | PRN
  Administered 2016-08-26: 80 mg via INTRAVENOUS

## 2016-08-26 MED ORDER — SODIUM CHLORIDE 0.9 % IV SOLN
INTRAVENOUS | Status: DC | PRN
  Administered 2016-08-26: 25 ug/min via INTRAVENOUS

## 2016-08-26 MED ORDER — CHLORHEXIDINE GLUCONATE 4 % EX LIQD: 60.0000 mL | Freq: Once | CUTANEOUS | Status: DC

## 2016-08-26 MED ORDER — BUPIVACAINE-EPINEPHRINE (PF) 0.25% -1:200000 IJ SOLN
INTRAMUSCULAR | Status: AC
  Filled 2016-08-26: qty 30

## 2016-08-26 MED ORDER — PROPOFOL 10 MG/ML IV BOLUS
INTRAVENOUS | Status: DC | PRN
  Administered 2016-08-26: 150 mg via INTRAVENOUS
  Administered 2016-08-26: 20 mg via INTRAVENOUS

## 2016-08-26 MED ORDER — PROMETHAZINE HCL 25 MG/ML IJ SOLN: 6.2500 mg | INTRAMUSCULAR | Status: DC | PRN

## 2016-08-26 MED ORDER — PROPOFOL 500 MG/50ML IV EMUL
INTRAVENOUS | Status: DC | PRN
  Administered 2016-08-26: 100 ug/kg/min via INTRAVENOUS

## 2016-08-26 MED ORDER — METOCLOPRAMIDE HCL 5 MG/ML IJ SOLN: 5.0000 mg | Freq: Three times a day (TID) | INTRAMUSCULAR | Status: DC | PRN

## 2016-08-26 MED ORDER — FENTANYL CITRATE (PF) 100 MCG/2ML IJ SOLN
INTRAMUSCULAR | Status: DC | PRN
  Administered 2016-08-26 (×2): 25 ug via INTRAVENOUS
  Administered 2016-08-26: 50 ug via INTRAVENOUS
  Administered 2016-08-26 (×2): 25 ug via INTRAVENOUS
  Administered 2016-08-26: 50 ug via INTRAVENOUS

## 2016-08-26 MED ORDER — ONDANSETRON HCL 4 MG PO TABS: 4.0000 mg | ORAL_TABLET | Freq: Four times a day (QID) | ORAL | Status: DC | PRN

## 2016-08-26 MED ORDER — ACETAMINOPHEN 10 MG/ML IV SOLN
INTRAVENOUS | Status: AC
  Filled 2016-08-26: qty 100

## 2016-08-26 MED ORDER — SODIUM CHLORIDE 0.9 % IV SOLN: INTRAVENOUS | Status: DC

## 2016-08-26 MED ORDER — HYDROCODONE-ACETAMINOPHEN 5-325 MG PO TABS: 1.0000 | ORAL_TABLET | ORAL | 0 refills | Status: DC | PRN

## 2016-08-26 MED ORDER — DEXAMETHASONE SODIUM PHOSPHATE 10 MG/ML IJ SOLN
INTRAMUSCULAR | Status: DC | PRN
  Administered 2016-08-26: 8 mg via INTRAVENOUS

## 2016-08-26 MED ORDER — LACTATED RINGERS IV SOLN
INTRAVENOUS | Status: DC
  Administered 2016-08-26 (×2): via INTRAVENOUS

## 2016-08-26 MED ORDER — PHENYLEPHRINE HCL 10 MG/ML IJ SOLN
INTRAMUSCULAR | Status: DC | PRN
  Administered 2016-08-26 (×4): 100 ug via INTRAVENOUS

## 2016-08-26 MED ORDER — MORPHINE SULFATE (PF) 4 MG/ML IV SOLN
INTRAVENOUS | Status: AC
  Filled 2016-08-26: qty 1

## 2016-08-26 MED ORDER — SCOPOLAMINE 1 MG/3DAYS TD PT72
MEDICATED_PATCH | TRANSDERMAL | Status: AC
  Administered 2016-08-26: 1.5 mg via TRANSDERMAL
  Filled 2016-08-26: qty 1

## 2016-08-26 MED ORDER — HYDROCODONE-ACETAMINOPHEN 5-325 MG PO TABS: 1.0000 | ORAL_TABLET | ORAL | Status: DC | PRN

## 2016-08-26 MED ORDER — BUPIVACAINE-EPINEPHRINE 0.25% -1:200000 IJ SOLN
INTRAMUSCULAR | Status: DC | PRN
  Administered 2016-08-26: 5 mL

## 2016-08-26 MED ORDER — CEFAZOLIN SODIUM 1 G IJ SOLR
INTRAMUSCULAR | Status: DC | PRN
  Administered 2016-08-26: 2 g via INTRAMUSCULAR

## 2016-08-26 MED ORDER — FENTANYL CITRATE (PF) 100 MCG/2ML IJ SOLN: 25.0000 ug | INTRAMUSCULAR | Status: DC | PRN

## 2016-08-26 MED ORDER — FAMOTIDINE 20 MG PO TABS
20.0000 mg | ORAL_TABLET | Freq: Once | ORAL | Status: AC
  Administered 2016-08-26: 20 mg via ORAL

## 2016-08-26 MED ORDER — SCOPOLAMINE 1 MG/3DAYS TD PT72
1.0000 | MEDICATED_PATCH | TRANSDERMAL | Status: DC
  Administered 2016-08-26: 1.5 mg via TRANSDERMAL

## 2016-08-26 MED ORDER — ACETAMINOPHEN 10 MG/ML IV SOLN
INTRAVENOUS | Status: DC | PRN
  Administered 2016-08-26: 1000 mg via INTRAVENOUS

## 2016-08-26 MED ORDER — METOCLOPRAMIDE HCL 10 MG PO TABS: 5.0000 mg | ORAL_TABLET | Freq: Three times a day (TID) | ORAL | Status: DC | PRN

## 2016-08-26 MED ORDER — ONDANSETRON HCL 4 MG/2ML IJ SOLN: 4.0000 mg | Freq: Four times a day (QID) | INTRAMUSCULAR | Status: DC | PRN

## 2016-08-26 MED ORDER — MEPERIDINE HCL 25 MG/ML IJ SOLN: 6.2500 mg | INTRAMUSCULAR | Status: DC | PRN

## 2016-08-26 MED ORDER — FAMOTIDINE 20 MG PO TABS
ORAL_TABLET | ORAL | Status: AC
  Administered 2016-08-26: 20 mg via ORAL
  Filled 2016-08-26: qty 1

## 2016-08-26 SURGICAL SUPPLY — 24 items
ADAPTER IRRIG TUBE 2 SPIKE SOL (ADAPTER) ×6 IMPLANT
BLADE SHAVER 4.5 DBL SERAT CV (CUTTER) ×3 IMPLANT
BNDG ESMARK 6X12 TAN STRL LF (GAUZE/BANDAGES/DRESSINGS) ×3 IMPLANT
CUFF TOURN 24 STER (MISCELLANEOUS) ×3 IMPLANT
CUFF TOURN 30 STER DUAL PORT (MISCELLANEOUS) IMPLANT
DRSG DERMACEA 8X12 NADH (GAUZE/BANDAGES/DRESSINGS) ×3 IMPLANT
DURAPREP 26ML APPLICATOR (WOUND CARE) ×3 IMPLANT
GAUZE SPONGE 4X4 12PLY STRL (GAUZE/BANDAGES/DRESSINGS) ×3 IMPLANT
GLOVE BIOGEL M STRL SZ7.5 (GLOVE) ×3 IMPLANT
GLOVE INDICATOR 8.0 STRL GRN (GLOVE) ×3 IMPLANT
GOWN STRL REUS W/ TWL LRG LVL3 (GOWN DISPOSABLE) ×2 IMPLANT
GOWN STRL REUS W/TWL LRG LVL3 (GOWN DISPOSABLE) ×4
IV LACTATED RINGER IRRG 3000ML (IV SOLUTION) ×12
IV LR IRRIG 3000ML ARTHROMATIC (IV SOLUTION) ×6 IMPLANT
KIT RM TURNOVER STRD PROC AR (KITS) ×3 IMPLANT
MANIFOLD NEPTUNE II (INSTRUMENTS) ×3 IMPLANT
PACK ARTHROSCOPY KNEE (MISCELLANEOUS) ×3 IMPLANT
SET TUBE SUCT SHAVER OUTFL 24K (TUBING) ×3 IMPLANT
SET TUBE TIP INTRA-ARTICULAR (MISCELLANEOUS) ×3 IMPLANT
SUT ETHILON 3-0 FS-10 30 BLK (SUTURE) ×3
SUTURE EHLN 3-0 FS-10 30 BLK (SUTURE) ×1 IMPLANT
TUBING ARTHRO INFLOW-ONLY STRL (TUBING) ×3 IMPLANT
WAND HAND CNTRL MULTIVAC 50 (MISCELLANEOUS) ×3 IMPLANT
WRAP KNEE W/COLD PACKS 25.5X14 (SOFTGOODS) ×3 IMPLANT

## 2016-08-26 NOTE — Transfer of Care (Signed)
Immediate Anesthesia Transfer of Care Note  Patient: Susan Ayala  Procedure(s) Performed: Procedure(s): ARTHROSCOPY KNEE (Right)  Patient Location: PACU  Anesthesia Type:General  Level of Consciousness: awake and alert   Airway & Oxygen Therapy: Patient Spontanous Breathing and Patient connected to face mask oxygen  Post-op Assessment: Report given to RN and Post -op Vital signs reviewed and stable  Post vital signs: Reviewed and stable  Last Vitals:  Vitals:   08/26/16 1445  BP: (!) 159/94  Pulse: 90  Resp: 16  Temp: 36.5 C    Last Pain:  Vitals:   08/26/16 1445  TempSrc: Oral         Complications: No apparent anesthesia complications

## 2016-08-26 NOTE — Anesthesia Postprocedure Evaluation (Signed)
Anesthesia Post Note  Patient: Susan Ayala  Procedure(s) Performed: Procedure(s) (LRB): ARTHROSCOPY KNEE (Right)  Patient location during evaluation: PACU Anesthesia Type: General Level of consciousness: awake and alert and oriented Pain management: pain level controlled Vital Signs Assessment: post-procedure vital signs reviewed and stable Respiratory status: spontaneous breathing, nonlabored ventilation and respiratory function stable Cardiovascular status: blood pressure returned to baseline and stable Postop Assessment: no signs of nausea or vomiting Anesthetic complications: no    Last Vitals:  Vitals:   08/26/16 1835 08/26/16 1901  BP: 116/86 (!) 158/72  Pulse: 91 78  Resp: 16 16  Temp: 36.2 C     Last Pain:  Vitals:   08/26/16 1835  TempSrc: Oral                 Jamarkus Lisbon

## 2016-08-26 NOTE — Brief Op Note (Signed)
08/26/2016  5:53 PM  PATIENT:  Susan Ayala  82 y.o. 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   Delay start of Pharmacological VTE agent (>24hrs) due to surgical blood loss or risk of bleeding: not applicable

## 2016-08-26 NOTE — Op Note (Signed)
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   ASSISTANT: none  ANESTHESIA: general  ESTIMATED BLOOD LOSS: Minimal  FLUIDS REPLACED: 1000 mL of crystalloid  TOURNIQUET TIME: Not used   DRAINS: none  IMPLANTS UTILIZED: None  INDICATIONS FOR SURGERY: [name] is a 82 y.o. year old female who has been seen for complaints of right knee pain. MRI demonstrated findings consistent with meniscal pathology. After discussion of the risks and benefits of surgical intervention, the patient expressed understanding of the risks benefits and agree with plans for right knee arthroscopy.   PROCEDURE IN DETAIL: The patient was brought into the operating room and, after adequate general anesthesia was achieved, a tourniquet was applied to the right thigh and the leg was placed in the leg holder. All bony prominences were well padded. The patient's right knee was cleaned and prepped with alcohol and Duraprep and draped in the usual sterile fashion. A "timeout" was performed as per usual protocol. The anticipated portal sites were injected with 0.25% Marcaine with epinephrine. An anterolateral incision was made and a cannula was inserted. A moderate effusion was evacuated and the knee was distended with fluid using the pump. The scope was advanced down the medial gutter into the medial compartment. Under visualization with  the scope, an anteromedial portal was created and a hooked probe was inserted. The medial meniscus was visualized and probed. There was a degenerative tear of the anterior horn of the medial meniscus. The tear was debrided using a 415 mm incisor shaver and then contoured using the 50 ArthroCare wand. The remaining rim of meniscus was visualized and probed and felt to be stable. The articular cartilage was visualized. Grade 3 changes of chondromalacia were noted merrily involving the medial femoral condyle. These areas were debrided and contoured using the ArthroCare wand.  The scope was then advanced into the intercondylar notch. The anterior cruciate ligament was visualized and probed and felt to be intact. The scope was removed from the lateral portal and reinserted via the anteromedial portal to better visualize the lateral compartment. The lateral meniscus was visualized and probed. Degenerative tears were noted to both the anterior and posterior horns of the lateral meniscus. The tears were debrided using meniscal punches and a 4.5 mm incisor shaver. Most of the posterior horn was resected. The remaining rim meniscus was contoured using the 50 ArthroCare wand. The articular cartilage of the lateral compartment was visualized. There was an area of grade 4 chondromalacia involving the lateral tibial plateau with lesser changes noted to the lateral femoral condyle. These areas were debrided and contoured using the ArthroCare wand. Finally, the scope was advanced so as to visualize the patellofemoral articulation. Good patellar tracking was appreciated. Grade 4 chondromalacia was noted to the patella as well as along the intercondylar sulcus. These areas were debrided and contoured using the ArthroCare wand.  The knee was irrigated with copius amounts of fluid and suctioned dry.  The anterolateral portal was re-approximated with #3-0 nylon. A combination of 0.25% Marcaine with epinephrine and 4 mg of Morphine  were injected via the scope. The scope was removed and the anteromedial portal was re-approximated with #3-0 nylon. A sterile dressing was applied followed by application of an ice wrap.  The patient tolerated the procedure well and was transported to the PACU in stable condition.  James P. Holley Bouche., M.D.

## 2016-08-26 NOTE — Anesthesia Preprocedure Evaluation (Signed)
Anesthesia Evaluation  Patient identified by MRN, date of birth, ID band Patient awake    Reviewed: Allergy & Precautions, NPO status , Patient's Chart, lab work & pertinent test results  History of Anesthesia Complications (+) PONV and history of anesthetic complications  Airway Mallampati: III  TM Distance: >3 FB Neck ROM: Full    Dental no notable dental hx.    Pulmonary neg pulmonary ROS, neg sleep apnea, neg COPD,    Pulmonary exam normal breath sounds clear to auscultation- rhonchi (-) wheezing      Cardiovascular Exercise Tolerance: Good hypertension, Pt. on medications (-) CAD and (-) Past MI  Rhythm:Regular Rate:Normal - Systolic murmurs and - Diastolic murmurs    Neuro/Psych Depression negative neurological ROS     GI/Hepatic negative GI ROS, Neg liver ROS,   Endo/Other  negative endocrine ROSneg diabetes  Renal/GU negative Renal ROS     Musculoskeletal  (+) Arthritis , Osteoarthritis,    Abdominal (+) - obese,   Peds  Hematology negative hematology ROS (+)   Anesthesia Other Findings Past Medical History: No date: Arthritis No date: Depression No date: Hypercholesterolemia No date: Hypertension No date: Osteoporosis No date: PONV (postoperative nausea and vomiting)   Reproductive/Obstetrics                             Anesthesia Physical Anesthesia Plan  ASA: II  Anesthesia Plan: General   Post-op Pain Management:    Induction: Intravenous  Airway Management Planned: LMA  Additional Equipment:   Intra-op Plan:   Post-operative Plan:   Informed Consent: I have reviewed the patients History and Physical, chart, labs and discussed the procedure including the risks, benefits and alternatives for the proposed anesthesia with the patient or authorized representative who has indicated his/her understanding and acceptance.   Dental advisory given  Plan Discussed  with: CRNA and Anesthesiologist  Anesthesia Plan Comments:         Anesthesia Quick Evaluation

## 2016-08-26 NOTE — Anesthesia Procedure Notes (Signed)
Procedure Name: LMA Insertion Performed by: Kyeisha Janowicz Pre-anesthesia Checklist: Patient identified, Patient being monitored, Timeout performed, Emergency Drugs available and Suction available Patient Re-evaluated:Patient Re-evaluated prior to inductionOxygen Delivery Method: Circle system utilized Preoxygenation: Pre-oxygenation with 100% oxygen Intubation Type: IV induction Ventilation: Mask ventilation without difficulty LMA: LMA inserted LMA Size: 4.0 Tube type: Oral Number of attempts: 1 Placement Confirmation: positive ETCO2 and breath sounds checked- equal and bilateral Tube secured with: Tape Dental Injury: Teeth and Oropharynx as per pre-operative assessment        

## 2016-08-26 NOTE — Discharge Instructions (Signed)
°  Instructions after Knee Arthroscopy  ° °- James P. Hooten, Jr., M.D.    ° Dept. of Orthopaedics & Sports Medicine ° [facility] ° [address] ° [place],   [address] ° ° Phone: [phone]   Fax: [phone] ° ° °DIET: °• Drink plenty of non-alcoholic fluids & begin a light diet. °• Resume your normal diet the day after surgery. ° °ACTIVITY:  °• You may use crutches or a walker with weight-bearing as tolerated, unless instructed otherwise. °• You may wean yourself off of the walker or crutches as tolerated.  °• Begin doing gentle exercises. Exercising will reduce the pain and swelling, increase motion, and prevent muscle weakness.   °• Avoid strenuous activities or athletics for a minimum of 4-6 weeks after arthroscopic surgery. °• Do not drive or operate any equipment until instructed. ° °WOUND CARE:  °• Place one to two pillows under the knee the first day or two when sitting or lying.  °• Continue to use the ice packs periodically to reduce pain and swelling. °• The small incisions in your knee are closed with nylon stitches. The stitches will be removed in the office. °• The bulky dressing may be removed on the second day after surgery. DO NOT TOUCH THE STITCHES. Put a Band-Aid over each stitch. Do NOT use any ointments or creams on the incisions.  °• You may bathe or shower after the stitches are removed at the first office visit following surgery. ° °MEDICATIONS: °• You may resume your regular medications. °• Please take the pain medication as prescribed. °• Do not take pain medication on an empty stomach. °• Do not drive or drink alcoholic beverages when taking pain medications. ° °CALL THE OFFICE FOR: °• Temperature above 101 degrees °• Excessive bleeding or drainage on the dressing. °• Excessive swelling, coldness, or paleness of the toes. °• Persistent nausea and vomiting. ° °FOLLOW-UP:  °• You should have an appointment to return to the office in 7-10 days after surgery.   ° ° °AMBULATORY SURGERY  °DISCHARGE INSTRUCTIONS ° ° °1) The drugs that you were given will stay in your system until tomorrow so for the next 24 hours you should not: ° °A) Drive an automobile °B) Make any legal decisions °C) Drink any alcoholic beverage ° ° °2) You may resume regular meals tomorrow.  Today it is better to start with liquids and gradually work up to solid foods. ° °You may eat anything you prefer, but it is better to start with liquids, then soup and crackers, and gradually work up to solid foods. ° ° °3) Please notify your doctor immediately if you have any unusual bleeding, trouble breathing, redness and pain at the surgery site, drainage, fever, or pain not relieved by medication. ° ° ° °4) Additional Instructions: ° ° ° ° ° ° ° °Please contact your physician with any problems or Same Day Surgery at [phone], Monday through Friday 6 am to 4 pm, or [place] [facility] Main number at [phone]. ° °

## 2016-08-26 NOTE — H&P (Signed)
The patient has been re-examined, and the chart reviewed, and there have been no interval changes to the documented history and physical.    The risks, benefits, and alternatives have been discussed at length. The patient expressed understanding of the risks benefits and agreed with plans for surgical intervention.  James P. Hooten, Jr. M.D.    

## 2016-08-27 ENCOUNTER — Encounter: Payer: Self-pay | Admitting: Orthopedic Surgery

## 2016-09-06 ENCOUNTER — Ambulatory Visit
Admission: EM | Admit: 2016-09-06 | Discharge: 2016-09-06 | Disposition: A | Discharge status: Home / Self Care | Payer: Medicare Other | Attending: Family Medicine | Admitting: Family Medicine

## 2016-09-06 DIAGNOSIS — J0100 Acute maxillary sinusitis, unspecified: Secondary | ICD-10-CM

## 2016-09-06 MED ORDER — AMOXICILLIN 875 MG PO TABS: 875.0000 mg | ORAL_TABLET | Freq: Two times a day (BID) | ORAL | 0 refills | Status: DC

## 2016-09-06 NOTE — ED Triage Notes (Signed)
Patient complains of coughing, shortness of breath. Patient states that she was intubated last week due to arthroscopic knee surgery. Patient reports that she has had some hoarsness. Patient states that symptoms started around 1 week ago.

## 2016-09-06 NOTE — ED Provider Notes (Signed)
[facility]    CSN: [id number] Arrival date & time: 09/06/16  1000  First Provider Contact:  None       History   Chief Complaint Chief Complaint  Patient presents with  . Cough    HPI Susan Ayala is a 82 y.o. female.   The history is provided by the patient.  URI  Presenting symptoms: congestion, cough, facial pain and fever   Severity:  Moderate Onset quality:  Sudden Duration:  1 week Timing:  Constant Progression:  Worsening Chronicity:  New Relieved by:  Nothing Ineffective treatments:  OTC medications Associated symptoms: sinus pain   Associated symptoms: no wheezing   Risk factors: chronic kidney disease and sick contacts   Risk factors: not elderly, no chronic cardiac disease, no chronic respiratory disease, no diabetes mellitus, no immunosuppression, no recent illness and no recent travel     Past Medical History:  Diagnosis Date  . Arthritis   . Depression   . Hypercholesterolemia   . Hypertension   . Osteoporosis   . PONV (postoperative nausea and vomiting)     There are no active problems to display for this patient.   Past Surgical History:  Procedure Laterality Date  . ABDOMINAL HYSTERECTOMY    . BREAST SURGERY Bilateral 1993   biopsy negative  . CATARACT EXTRACTION, BILATERAL Bilateral 2013   eyes done 30 days apart  . 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 Right total hip arthroplasty  02/16/2014  . TONSILLECTOMY      OB History    No data available       Home Medications    Prior to Admission medications   Medication Sig Start Date End Date Taking? Authorizing Provider  aspirin 81 MG chewable tablet Chew 81 mg by mouth daily.   Yes Historical Provider, MD  Calcium  Carb-Cholecalciferol (CALCIUM 600+D) 600-800 MG-UNIT TABS Take 1 tablet by mouth daily.   Yes Historical Provider, MD  Cholecalciferol (VITAMIN D3) 1000 units CAPS Take 1 capsule by mouth daily.   Yes Historical Provider, MD  hydrochlorothiazide (HYDRODIURIL) 25 MG tablet Take 25 mg by mouth every morning.   Yes Historical Provider, MD  Ibuprofen-Diphenhydramine Cit ([employer] IBUPROFEN PM PO) Take 1 tablet by mouth at bedtime as needed.   Yes Historical Provider, MD  lovastatin (MEVACOR) 40 MG tablet Take 40 mg by mouth at bedtime.   Yes Historical Provider, MD  Multiple Vitamins-Minerals (MULTI-VITAMIN/MINERALS PO) Take 1 tablet by mouth 2 (two) times daily.   Yes Historical Provider, MD  potassium chloride (MICRO-K) 10 MEQ CR capsule Take 20 mEq by mouth daily.   Yes Historical Provider, MD  sertraline (ZOLOFT) 100 MG tablet Take 100 mg by mouth at bedtime.   Yes Historical Provider, MD  amoxicillin (AMOXIL) 875 MG tablet Take 1 tablet (875 mg total) by mouth 2 (two) times daily. 09/06/16   Norval Gable, MD  HYDROcodone-acetaminophen (NORCO) 5-325 MG tablet Take 1-2 tablets by mouth every 4 (four) hours as needed for moderate pain. 08/26/16   Dereck Leep, MD    Family History History reviewed. No pertinent family history.  Social History Social History  Substance Use Topics  . Smoking status: Never Smoker  . Smokeless tobacco: Never Used  . Alcohol use 0.0 -  0.6 oz/week     Comment: rare, 1-2 per month     Allergies   Codeine and Adhesive [tape]   Review of Systems Review of Systems  Constitutional: Positive for fever.  HENT: Positive for congestion.   Respiratory: Positive for cough. Negative for wheezing.      Physical Exam Triage Vital Signs ED Triage Vitals  Enc Vitals Group     BP 09/06/16 1059 (!) 142/82     Pulse Rate 09/06/16 1059 84     Resp 09/06/16 1059 16     Temp 09/06/16 1059 97 F (36.1 C)     Temp Source 09/06/16 1059 Tympanic     SpO2 09/06/16 1059 99 %       Weight 09/06/16 1059 165 lb (74.8 kg)     Height 09/06/16 1059 5\' 4"  (1.626 m)     Head Circumference --      Peak Flow --      Pain Score 09/06/16 1101 2     Pain Loc --      Pain Edu? --      Excl. in [place]? --    No data found.   Updated Vital Signs BP (!) 142/82 (BP Location: Left Arm)   Pulse 84   Temp 97 F (36.1 C) (Tympanic)   Resp 16   Ht 5\' 4"  (1.626 m)   Wt 165 lb (74.8 kg)   SpO2 99%   BMI 28.32 kg/m   Visual Acuity Right Eye Distance:   Left Eye Distance:   Bilateral Distance:    Right Eye Near:   Left Eye Near:    Bilateral Near:     Physical Exam  Constitutional: She appears well-developed and well-nourished. No distress.  HENT:  Head: Normocephalic and atraumatic.  Right Ear: Tympanic membrane, external ear and ear canal normal.  Left Ear: Tympanic membrane, external ear and ear canal normal.  Nose: Mucosal edema and rhinorrhea present. No nose lacerations, sinus tenderness, nasal deformity, septal deviation or nasal septal hematoma. No epistaxis.  No foreign bodies. Right sinus exhibits maxillary sinus tenderness and frontal sinus tenderness. Left sinus exhibits maxillary sinus tenderness and frontal sinus tenderness.  Mouth/Throat: Uvula is midline, oropharynx is clear and moist and mucous membranes are normal. No oropharyngeal exudate.  Eyes: Conjunctivae and EOM are normal. Pupils are equal, round, and reactive to light. Right eye exhibits no discharge. Left eye exhibits no discharge. No scleral icterus.  Neck: Normal range of motion. Neck supple. No thyromegaly present.  Cardiovascular: Normal rate, regular rhythm and normal heart sounds.   Pulmonary/Chest: Effort normal and breath sounds normal. No respiratory distress. She has no wheezes. She has no rales.  Lymphadenopathy:    She has no cervical adenopathy.  Skin: She is not diaphoretic.  Nursing note and vitals reviewed.    UC Treatments / Results  Labs (all labs ordered are listed, but  only abnormal results are displayed) Labs Reviewed - No data to display  EKG  EKG Interpretation None       Radiology No results found.  Procedures Procedures (including critical care time)  Medications Ordered in UC Medications - No data to display   Initial Impression / Assessment and Plan / UC Course  I have reviewed the triage vital signs and the nursing notes.  Pertinent labs & imaging results that were available during my care of the patient were reviewed by me and considered in my medical decision making (see chart for details).  Clinical Course  Final Clinical Impressions(s) / UC Diagnoses   Final diagnoses:  Acute maxillary sinusitis, recurrence not specified    New Prescriptions Discharge Medication List as of 09/06/2016 12:42 PM    START taking these medications   Details  amoxicillin (AMOXIL) 875 MG tablet Take 1 tablet (875 mg total) by mouth 2 (two) times daily., Starting Fri 09/06/2016, Normal       1. diagnosis reviewed with patient 2. rx as per orders above; reviewed possible side effects, interactions, risks and benefits  3. Recommend supportive treatment with otc analgesics prn 4. Follow-up prn if symptoms worsen or don't improve   Norval Gable, MD 09/06/16 1434

## 2016-09-09 ENCOUNTER — Telehealth: Payer: Self-pay | Admitting: *Deleted

## 2017-04-02 ENCOUNTER — Ambulatory Visit
Admission: EM | Admit: 2017-04-02 | Discharge: 2017-04-02 | Disposition: A | Discharge status: Home / Self Care | Payer: Medicare Other | Attending: Family Medicine | Admitting: Family Medicine

## 2017-04-02 DIAGNOSIS — R05 Cough: Secondary | ICD-10-CM | POA: Diagnosis not present

## 2017-04-02 DIAGNOSIS — J0100 Acute maxillary sinusitis, unspecified: Secondary | ICD-10-CM

## 2017-04-02 DIAGNOSIS — R059 Cough, unspecified: Secondary | ICD-10-CM

## 2017-04-02 MED ORDER — AMOXICILLIN 875 MG PO TABS: 875.0000 mg | ORAL_TABLET | Freq: Two times a day (BID) | ORAL | 0 refills | Status: DC

## 2017-04-02 NOTE — ED Provider Notes (Signed)
[facility]    CSN: [id number] Arrival date & time: 04/02/17  6387     History   Chief Complaint Chief Complaint  Patient presents with  . Cough    HPI Susan Ayala is a 83 y.o. female.    Cough  Associated symptoms: ear pain   Associated symptoms: no wheezing   URI  Presenting symptoms: congestion, cough, ear pain and facial pain   Severity:  Moderate Onset quality:  Sudden Timing:  Constant Progression:  Worsening Chronicity:  New Relieved by:  Nothing Ineffective treatments:  OTC medications Associated symptoms: sinus pain   Associated symptoms: no wheezing   Risk factors: being elderly   Risk factors: no chronic cardiac disease, no chronic kidney disease, no chronic respiratory disease, no diabetes mellitus, no immunosuppression, no recent illness, no recent travel and no sick contacts     Past Medical History:  Diagnosis Date  . Arthritis   . Depression   . Hypercholesterolemia   . Hypertension   . Osteoporosis   . PONV (postoperative nausea and vomiting)     There are no active problems to display for this patient.   Past Surgical History:  Procedure Laterality Date  . ABDOMINAL HYSTERECTOMY    . BREAST SURGERY Bilateral 1993   biopsy negative  . CATARACT EXTRACTION, BILATERAL Bilateral 2013   eyes done 30 days apart  . 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 Right total hip arthroplasty  02/16/2014  . TONSILLECTOMY      OB History    No data available       Home Medications    Prior to Admission medications   Medication Sig Start Date End Date Taking? Authorizing Provider  aspirin 81 MG chewable tablet Chew 81 mg by mouth daily.   Yes Historical Provider, MD  Calcium  Carb-Cholecalciferol (CALCIUM 600+D) 600-800 MG-UNIT TABS Take 1 tablet by mouth daily.   Yes Historical Provider, MD  Cholecalciferol (VITAMIN D3) 1000 units CAPS Take 1 capsule by mouth daily.   Yes Historical Provider, MD  hydrochlorothiazide (HYDRODIURIL) 25 MG tablet Take 25 mg by mouth every morning.   Yes Historical Provider, MD  Ibuprofen-Diphenhydramine Cit ([employer] IBUPROFEN PM PO) Take 1 tablet by mouth at bedtime as needed.   Yes Historical Provider, MD  loratadine (CLARITIN) 10 MG tablet Take 10 mg by mouth daily.   Yes Historical Provider, MD  lovastatin (MEVACOR) 40 MG tablet Take 40 mg by mouth at bedtime.   Yes Historical Provider, MD  Multiple Vitamins-Minerals (MULTI-VITAMIN/MINERALS PO) Take 1 tablet by mouth 2 (two) times daily.   Yes Historical Provider, MD  potassium chloride (MICRO-K) 10 MEQ CR capsule Take 20 mEq by mouth daily.   Yes Historical Provider, MD  sertraline (ZOLOFT) 100 MG tablet Take 100 mg by mouth at bedtime.   Yes Historical Provider, MD  amoxicillin (AMOXIL) 875 MG tablet Take 1 tablet (875 mg total) by mouth 2 (two) times daily. 04/02/17   Norval Gable, MD  HYDROcodone-acetaminophen (NORCO) 5-325 MG tablet Take 1-2 tablets by mouth every 4 (four) hours as needed for moderate pain. 08/26/16   Dereck Leep, MD    Family History History reviewed. No pertinent family history.  Social History Social History  Substance Use Topics  .  Smoking status: Never Smoker  . Smokeless tobacco: Never Used  . Alcohol use 0.0 - 0.6 oz/week     Comment: rare, 1-2 per month     Allergies   Codeine and Adhesive [tape]   Review of Systems Review of Systems  HENT: Positive for congestion, ear pain and sinus pain.   Respiratory: Positive for cough. Negative for wheezing.      Physical Exam Triage Vital Signs ED Triage Vitals  Enc Vitals Group     BP 04/02/17 0855 139/88     Pulse Rate 04/02/17 0855 83     Resp 04/02/17 0855 17     Temp 04/02/17 0855 98.3 F  (36.8 C)     Temp Source 04/02/17 0855 Oral     SpO2 04/02/17 0855 96 %     Weight 04/02/17 0851 165 lb (74.8 kg)     Height 04/02/17 0851 5\' 6"  (1.676 m)     Head Circumference --      Peak Flow --      Pain Score 04/02/17 0851 0     Pain Loc --      Pain Edu? --      Excl. in [place]? --    No data found.   Updated Vital Signs BP 139/88 (BP Location: Left Arm)   Pulse 83   Temp 98.3 F (36.8 C) (Oral)   Resp 17   Ht 5\' 6"  (1.676 m)   Wt 165 lb (74.8 kg)   SpO2 96%   BMI 26.63 kg/m   Visual Acuity Right Eye Distance:   Left Eye Distance:   Bilateral Distance:    Right Eye Near:   Left Eye Near:    Bilateral Near:     Physical Exam  Constitutional: She appears well-developed and well-nourished. No distress.  HENT:  Head: Normocephalic and atraumatic.  Right Ear: Tympanic membrane, external ear and ear canal normal.  Left Ear: Tympanic membrane, external ear and ear canal normal.  Nose: Mucosal edema and rhinorrhea present. No nose lacerations, sinus tenderness, nasal deformity, septal deviation or nasal septal hematoma. No epistaxis.  No foreign bodies. Right sinus exhibits maxillary sinus tenderness and frontal sinus tenderness. Left sinus exhibits maxillary sinus tenderness and frontal sinus tenderness.  Mouth/Throat: Uvula is midline, oropharynx is clear and moist and mucous membranes are normal. No oropharyngeal exudate.  Eyes: Conjunctivae and EOM are normal. Pupils are equal, round, and reactive to light. Right eye exhibits no discharge. Left eye exhibits no discharge. No scleral icterus.  Neck: Normal range of motion. Neck supple. No thyromegaly present.  Cardiovascular: Normal rate, regular rhythm and normal heart sounds.   Pulmonary/Chest: Effort normal. No respiratory distress. She has no wheezes. She has no rales.  rhonchi  Lymphadenopathy:    She has no cervical adenopathy.  Skin: She is not diaphoretic.  Nursing note and vitals reviewed.    UC  Treatments / Results  Labs (all labs ordered are listed, but only abnormal results are displayed) Labs Reviewed - No data to display  EKG  EKG Interpretation None       Radiology No results found.  Procedures Procedures (including critical care time)  Medications Ordered in UC Medications - No data to display   Initial Impression / Assessment and Plan / UC Course  I have reviewed the triage vital signs and the nursing notes.  Pertinent labs & imaging results that were available during my care of the patient were reviewed by me and considered in  my medical decision making (see chart for details).       Final Clinical Impressions(s) / UC Diagnoses   Final diagnoses:  Acute maxillary sinusitis, recurrence not specified  Cough    New Prescriptions New Prescriptions   AMOXICILLIN (AMOXIL) 875 MG TABLET    Take 1 tablet (875 mg total) by mouth 2 (two) times daily.   1. diagnosis reviewed with patient 2. rx as per orders above; reviewed possible side effects, interactions, risks and benefits; continue tessalon perles patient has at home  3. Follow-up prn if symptoms worsen or don't improve   Norval Gable, MD 04/02/17 [id number]

## 2017-04-02 NOTE — ED Triage Notes (Signed)
Patient complains of cough, congestion, productive cough x 4 days. Patient states that she has tried coricidin and mucinex without relief. Patient reports recent travel to [place] on an airplane and concerned that she picked something up while traveling.

## 2017-09-09 ENCOUNTER — Observation Stay
Admission: EM | Admit: 2017-09-09 | Discharge: 2017-09-10 | Disposition: A | Discharge status: Home / Self Care | Payer: Medicare Other | Attending: Specialist | Admitting: Specialist

## 2017-09-09 DIAGNOSIS — F329 Major depressive disorder, single episode, unspecified: Secondary | ICD-10-CM | POA: Diagnosis not present

## 2017-09-09 DIAGNOSIS — Z7982 Long term (current) use of aspirin: Secondary | ICD-10-CM | POA: Diagnosis not present

## 2017-09-09 DIAGNOSIS — Z66 Do not resuscitate: Secondary | ICD-10-CM | POA: Insufficient documentation

## 2017-09-09 DIAGNOSIS — R42 Dizziness and giddiness: Principal | ICD-10-CM

## 2017-09-09 DIAGNOSIS — Z96641 Presence of right artificial hip joint: Secondary | ICD-10-CM | POA: Insufficient documentation

## 2017-09-09 DIAGNOSIS — I10 Essential (primary) hypertension: Secondary | ICD-10-CM | POA: Diagnosis not present

## 2017-09-09 DIAGNOSIS — E785 Hyperlipidemia, unspecified: Secondary | ICD-10-CM | POA: Insufficient documentation

## 2017-09-09 DIAGNOSIS — E7800 Pure hypercholesterolemia, unspecified: Secondary | ICD-10-CM | POA: Diagnosis not present

## 2017-09-09 LAB — COMPREHENSIVE METABOLIC PANEL
ALBUMIN: 4.4 g/dL (ref 3.5–5.0)
ALK PHOS: 61 U/L (ref 38–126)
ALT: 17 U/L (ref 14–54)
AST: 36 U/L (ref 15–41)
Anion gap: 13 (ref 5–15)
BUN: 18 mg/dL (ref 6–20)
CHLORIDE: 102 mmol/L (ref 101–111)
CO2: 24 mmol/L (ref 22–32)
CREATININE: 0.81 mg/dL (ref 0.44–1.00)
Calcium: 10.3 mg/dL (ref 8.9–10.3)
GFR calc Af Amer: >60 mL/min (ref >60)
GFR calc non Af Amer: >60 mL/min (ref >60)
GLUCOSE: 141 mg/dL — AB (ref 65–99)
Potassium: 3.2 mmol/L — ABNORMAL LOW (ref 3.5–5.1)
SODIUM: 139 mmol/L (ref 135–145)
Total Bilirubin: 0.9 mg/dL (ref 0.3–1.2)
Total Protein: 7.6 g/dL (ref 6.5–8.1)

## 2017-09-09 LAB — CBC
HCT: 39.7 % (ref 35.0–47.0)
HEMOGLOBIN: 13.7 g/dL (ref 12.0–16.0)
MCH: 29.5 pg (ref 26.0–34.0)
MCHC: 34.4 g/dL (ref 32.0–36.0)
MCV: 85.8 fL (ref 80.0–100.0)
Platelets: 288 10*3/uL (ref 150–440)
RBC: 4.63 MIL/uL (ref 3.80–5.20)
RDW: 14.3 % (ref 11.5–14.5)
WBC: 9.5 10*3/uL (ref 3.6–11.0)

## 2017-09-09 MED ORDER — HYDROCHLOROTHIAZIDE 25 MG PO TABS
25.0000 mg | ORAL_TABLET | Freq: Every day | ORAL | Status: DC
  Administered 2017-09-09 – 2017-09-10 (×2): 25 mg via ORAL
  Filled 2017-09-09 (×2): qty 1

## 2017-09-09 MED ORDER — PRAVASTATIN SODIUM 20 MG PO TABS
40.0000 mg | ORAL_TABLET | Freq: Every day | ORAL | Status: DC
  Administered 2017-09-09: 40 mg via ORAL
  Filled 2017-09-09: qty 2

## 2017-09-09 MED ORDER — SERTRALINE HCL 50 MG PO TABS
50.0000 mg | ORAL_TABLET | Freq: Every day | ORAL | Status: DC
  Administered 2017-09-09: 50 mg via ORAL
  Filled 2017-09-09: qty 1

## 2017-09-09 MED ORDER — ASPIRIN 81 MG PO CHEW
81.0000 mg | CHEWABLE_TABLET | Freq: Every day | ORAL | Status: DC
  Administered 2017-09-09 – 2017-09-10 (×2): 81 mg via ORAL
  Filled 2017-09-09 (×2): qty 1

## 2017-09-09 MED ORDER — ONDANSETRON HCL 4 MG/2ML IJ SOLN: 4.0000 mg | Freq: Four times a day (QID) | INTRAMUSCULAR | Status: DC | PRN

## 2017-09-09 MED ORDER — ALBUTEROL SULFATE (2.5 MG/3ML) 0.083% IN NEBU: 2.5000 mg | INHALATION_SOLUTION | RESPIRATORY_TRACT | Status: DC | PRN

## 2017-09-09 MED ORDER — INFLUENZA VAC SPLIT HIGH-DOSE 0.5 ML IM SUSY
0.5000 mL | PREFILLED_SYRINGE | INTRAMUSCULAR | Status: DC
  Filled 2017-09-09: qty 0.5

## 2017-09-09 MED ORDER — POTASSIUM CHLORIDE IN NACL 20-0.9 MEQ/L-% IV SOLN
INTRAVENOUS | Status: AC
  Administered 2017-09-09: 15:00:00 via INTRAVENOUS
  Filled 2017-09-09: qty 1000

## 2017-09-09 MED ORDER — ACETAMINOPHEN 325 MG PO TABS: 650.0000 mg | ORAL_TABLET | Freq: Four times a day (QID) | ORAL | Status: DC | PRN

## 2017-09-09 MED ORDER — SODIUM CHLORIDE 0.9 % IV SOLN
1000.0000 mL | Freq: Once | INTRAVENOUS | Status: AC
  Administered 2017-09-09: 1000 mL via INTRAVENOUS

## 2017-09-09 MED ORDER — HYDRALAZINE HCL 20 MG/ML IJ SOLN: 10.0000 mg | Freq: Four times a day (QID) | INTRAMUSCULAR | Status: DC | PRN

## 2017-09-09 MED ORDER — POTASSIUM CHLORIDE CRYS ER 20 MEQ PO TBCR
20.0000 meq | EXTENDED_RELEASE_TABLET | Freq: Every day | ORAL | Status: DC
  Administered 2017-09-10: 20 meq via ORAL
  Filled 2017-09-09: qty 1

## 2017-09-09 MED ORDER — POLYETHYLENE GLYCOL 3350 17 G PO PACK: 17.0000 g | PACK | Freq: Every day | ORAL | Status: DC | PRN

## 2017-09-09 MED ORDER — ONDANSETRON HCL 4 MG PO TABS: 4.0000 mg | ORAL_TABLET | Freq: Four times a day (QID) | ORAL | Status: DC | PRN

## 2017-09-09 MED ORDER — LORAZEPAM 1 MG PO TABS
1.0000 mg | ORAL_TABLET | Freq: Once | ORAL | Status: AC
  Administered 2017-09-09: 1 mg via ORAL

## 2017-09-09 MED ORDER — MECLIZINE HCL 25 MG PO TABS
25.0000 mg | ORAL_TABLET | Freq: Three times a day (TID) | ORAL | Status: DC
  Administered 2017-09-09 – 2017-09-10 (×2): 25 mg via ORAL
  Filled 2017-09-09 (×2): qty 1

## 2017-09-09 MED ORDER — ACETAMINOPHEN 650 MG RE SUPP: 650.0000 mg | Freq: Four times a day (QID) | RECTAL | Status: DC | PRN

## 2017-09-09 MED ORDER — POTASSIUM CHLORIDE CRYS ER 20 MEQ PO TBCR
40.0000 meq | EXTENDED_RELEASE_TABLET | ORAL | Status: AC
  Administered 2017-09-09 (×2): 40 meq via ORAL
  Filled 2017-09-09 (×2): qty 2

## 2017-09-09 MED ORDER — LORAZEPAM 1 MG PO TABS
ORAL_TABLET | ORAL | Status: AC
  Filled 2017-09-09: qty 1

## 2017-09-09 MED ORDER — ENOXAPARIN SODIUM 40 MG/0.4ML ~~LOC~~ SOLN
40.0000 mg | SUBCUTANEOUS | Status: DC
  Administered 2017-09-09: 40 mg via SUBCUTANEOUS
  Filled 2017-09-09: qty 0.4

## 2017-09-09 MED ORDER — MECLIZINE HCL 25 MG PO TABS
25.0000 mg | ORAL_TABLET | Freq: Once | ORAL | Status: AC
  Administered 2017-09-09: 25 mg via ORAL
  Filled 2017-09-09: qty 1

## 2017-09-09 NOTE — ED Provider Notes (Signed)
[facility] Emergency Department Provider Note   ____________________________________________    I have reviewed the triage vital signs and the nursing notes.   HISTORY  Chief Complaint Dizziness and Nausea     HPI Susan Ayala is a 83 y.o. female Who presents with complaints of dizziness. Patient reports over the last 3 days she has had episodes of severe vertigo. She notes she has had this in the past and it feels similar however it is lasting longer than typical. She denies headaches. No neuro deficits. She reports that when she has vertigo she will get nauseated and occasionally vomit. Symptoms are worse when she moves her head. No fevers or chills. No neck pain. No trauma. She took a Dramamine earlier today, in the past she has used meclizine but did not have any   Past Medical History:  Diagnosis Date  . Arthritis   . Depression   . Hypercholesterolemia   . Hypertension   . Osteoporosis   . PONV (postoperative nausea and vomiting)     There are no active problems to display for this patient.   Past Surgical History:  Procedure Laterality Date  . ABDOMINAL HYSTERECTOMY    . BREAST SURGERY Bilateral 1993   biopsy negative  . CATARACT EXTRACTION, BILATERAL Bilateral 2013   eyes done 30 days apart  . 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 Right total hip arthroplasty  02/16/2014  . TONSILLECTOMY      Prior to Admission medications   Medication Sig Start Date End Date Taking? Authorizing Provider  aspirin 81 MG chewable tablet Chew 81 mg by mouth daily.   Yes [provider]  Calcium Carb-Cholecalciferol (CALCIUM 600+D) 600-800 MG-UNIT TABS Take 1 tablet by mouth daily.   Yes [provider]  Cholecalciferol (VITAMIN D3) 1000 units CAPS Take 1 capsule by mouth daily.   Yes [provider]  hydrochlorothiazide (HYDRODIURIL) 25 MG tablet Take 25 mg by mouth every morning.   Yes [provider]  Ibuprofen-Diphenhydramine Cit ([employer] IBUPROFEN PM PO) Take 1 tablet by mouth at bedtime as needed.   Yes [provider]  lovastatin (MEVACOR) 40 MG tablet Take 40 mg by mouth at bedtime.   Yes [provider]  Multiple Vitamins-Minerals (MULTI-VITAMIN/MINERALS PO) Take 1 tablet by mouth daily.    Yes [provider]  potassium chloride (MICRO-K) 10 MEQ CR capsule Take 20 mEq by mouth daily.   Yes [provider]  sertraline (ZOLOFT) 50 MG tablet Take 50 mg by mouth at bedtime.  08/01/17  Yes [provider]  HYDROcodone-acetaminophen (NORCO) 5-325 MG tablet Take 1-2 tablets by mouth every 4 (four) hours as needed for moderate pain. Patient not taking: Reported on 09/09/2017 08/26/16   Dereck Leep, MD     Allergies Codeine and Adhesive [tape]  No family history on file.  Social History Social History  Substance Use Topics  . Smoking status: Never Smoker  . Smokeless tobacco: Never Used  . Alcohol use 0.0 - 0.6 oz/week     Comment: rare, 1-2 per month    Review of Systems  Constitutional: No fever/chills, dizziness as above Eyes: No visual changes.  ENT: No sore throat. Cardiovascular: Denies chest pain. Respiratory: Denies shortness of  breath. Gastrointestinal: No abdominal pain.   Genitourinary: Negative for dysuria. Musculoskeletal: Negative for back pain. Skin: Negative for rash. Neurological: Negative for headaches or weakness   ____________________________________________   PHYSICAL EXAM:  VITAL SIGNS: ED Triage Vitals  Enc Vitals Group     BP 09/09/17 0833 135/84     Pulse Rate 09/09/17 0833 91     Resp 09/09/17 0833 18     Temp 09/09/17 0833 98 F (36.7 C)     Temp Source 09/09/17  0833 Oral     SpO2 09/09/17 0833 96 %     Weight 09/09/17 0837 80.3 kg (177 lb)     Height 09/09/17 0837 1.651 m (5\' 5" )     Head Circumference --      Peak Flow --      Pain Score --      Pain Loc --      Pain Edu? --      Excl. in [place]? --     Constitutional: Alert and oriented. No acute distress. Pleasant and interactive Eyes: Conjunctivae are normal. PERRLA and EOMI Head: Atraumatic. Nose: No congestion/rhinnorhea. Mouth/Throat: Mucous membranes are moist.    Cardiovascular: Normal rate, regular rhythm. Grossly normal heart sounds.  Good peripheral circulation. Respiratory: Normal respiratory effort.  No retractions. Lungs CTAB. Gastrointestinal: Soft and nontender. No distention.  No CVA tenderness. Genitourinary: deferred Musculoskeletal: No lower extremity tenderness nor edema.  Warm and well perfused Neurologic:  Normal speech and language. No gross focal neurologic deficits are appreciated. No dysdiadochokinesis Skin:  Skin is warm, dry and intact. No rash noted. Psychiatric: Mood and affect are normal. Speech and behavior are normal.  ____________________________________________   LABS (all labs ordered are listed, but only abnormal results are displayed)  Labs Reviewed  COMPREHENSIVE METABOLIC PANEL - Abnormal; Notable for the following:       Result Value   Potassium 3.2 (*)    Glucose, Bld 141 (*)    All other components within normal limits  CBC   ____________________________________________  EKG  ED ECG REPORT I, Lavonia Drafts, the attending physician, personally viewed and interpreted this ECG.  Date: 09/09/2017 EKG Time: 8:33 AM  Rhythm: normal sinus rhythm QRS Axis: normal Intervals: normal ST/T Wave abnormalities: normal Narrative Interpretation: no evidence of acute ischemia  ____________________________________________  RADIOLOGY  None ____________________________________________   PROCEDURES  Procedure(s) performed:  No    Critical Care performed: No ____________________________________________   INITIAL IMPRESSION / ASSESSMENT AND PLAN / ED COURSE  Pertinent labs & imaging results that were available during my care of the patient were reviewed by me and considered in my medical decision making (see chart for details).  Differential diagnosis includes vertigo, electrolyte abnormality's, nonspecific dizziness  Patient well-appearing and in no acute distress. History of BPV in the past, this appears to be the same condition, no concerning neuro findings. We will treat with IV fluids and meclizine and reevaluate  ----------------------------------------- 11:40 AM on 09/09/2017 -----------------------------------------  Patient remains quite dizzy, her nausea has resolved however she is not steady on her feet, discuss with hospitalist for admission    ____________________________________________   FINAL CLINICAL IMPRESSION(S) / ED DIAGNOSES  Final diagnoses:  Vertigo      NEW MEDICATIONS STARTED DURING THIS VISIT:  New Prescriptions   No medications on file     Note:  This document was prepared using Dragon voice recognition software and may include unintentional dictation errors.    Lavonia Drafts, MD 09/09/17 1140

## 2017-09-09 NOTE — ED Notes (Signed)
Pt assisted up to the toilet with assistance x2 and back to the stretcher.

## 2017-09-09 NOTE — ED Notes (Signed)
Patient reports continues dizziness

## 2017-09-09 NOTE — ED Notes (Signed)
Called [phone] and gave report to [facility]

## 2017-09-09 NOTE — H&P (Signed)
Susan Ayala at [place] NAME: Susan Ayala    MR#:  [id number]  DATE OF BIRTH:  [birth year]  DATE OF ADMISSION:  09/09/2017  PRIMARY CARE PHYSICIAN: Ezequiel Kayser, MD   REQUESTING/REFERRING PHYSICIAN: Dr. Corky Downs  CHIEF COMPLAINT:   Chief Complaint  Patient presents with  . Dizziness  . Nausea    HISTORY OF PRESENT ILLNESS:  Susan Ayala  is a 83 y.o. female with a known history of hypertension, arthritis, vertigo presents to the emergency room with 3 days of worsening room spinning sensation. She tried Dramamine at home without any help. In the emergency room she was given Ativan and meclizine with some improvement. She is stillneeding 2 people to assist her to get out of bed and has significant dizziness. No focal neurological deficits. Has ringing in her ears.  PAST MEDICAL HISTORY:   Past Medical History:  Diagnosis Date  . Arthritis   . Depression   . Hypercholesterolemia   . Hypertension   . Osteoporosis   . PONV (postoperative nausea and vomiting)     PAST SURGICAL HISTORY:   Past Surgical History:  Procedure Laterality Date  . ABDOMINAL HYSTERECTOMY    . BREAST SURGERY Bilateral 1993   biopsy negative  . CATARACT EXTRACTION, BILATERAL Bilateral 2013   eyes done 30 days apart  . 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 Right total hip arthroplasty  02/16/2014  . TONSILLECTOMY      SOCIAL HISTORY:   Social History  Substance Use Topics  . Smoking status: Never Smoker  . Smokeless tobacco: Never Used  . Alcohol use 0.0 - 0.6 oz/week     Comment: rare, 1-2 per month    FAMILY HISTORY:   Family History  Problem Relation Age of Onset  . Colon cancer Father     DRUG ALLERGIES:    Allergies  Allergen Reactions  . Codeine Nausea And Vomiting  . Adhesive [Tape] Itching and Rash    On tender skin.     REVIEW OF SYSTEMS:   Review of Systems  Constitutional: Positive for malaise/fatigue. Negative for chills, fever and weight loss.  HENT: Negative for hearing loss and nosebleeds.   Eyes: Negative for blurred vision, double vision and pain.  Respiratory: Negative for cough, hemoptysis, sputum production, shortness of breath and wheezing.   Cardiovascular: Negative for chest pain, palpitations, orthopnea and leg swelling.  Gastrointestinal: Negative for abdominal pain, constipation, diarrhea, nausea and vomiting.  Genitourinary: Negative for dysuria and hematuria.  Musculoskeletal: Negative for back pain, falls and myalgias.  Skin: Negative for rash.  Neurological: Positive for dizziness and weakness. Negative for tremors, sensory change, speech change, focal weakness, seizures and headaches.  Endo/Heme/Allergies: Does not bruise/bleed easily.  Psychiatric/Behavioral: Negative for depression and memory loss. The patient is not nervous/anxious.     MEDICATIONS AT HOME:   Prior to Admission medications   Medication Sig Start Date End Date Taking? Authorizing Provider  aspirin 81 MG chewable tablet Chew 81 mg by mouth daily.   Yes [provider]  Calcium Carb-Cholecalciferol (CALCIUM 600+D) 600-800 MG-UNIT TABS Take 1 tablet by mouth daily.   Yes [provider]  Cholecalciferol (VITAMIN D3) 1000 units CAPS Take 1 capsule by mouth daily.   Yes [provider]  hydrochlorothiazide (HYDRODIURIL) 25 MG tablet Take 25 mg by mouth every morning.   Yes [provider]  Ibuprofen-Diphenhydramine Cit ([employer] IBUPROFEN PM PO) Take 1 tablet by mouth at bedtime as needed.   Yes [provider]  lovastatin (MEVACOR) 40 MG tablet Take 40 mg by mouth at bedtime.   Yes [provider]  Multiple Vitamins-Minerals  (MULTI-VITAMIN/MINERALS PO) Take 1 tablet by mouth daily.    Yes [provider]  potassium chloride (MICRO-K) 10 MEQ CR capsule Take 20 mEq by mouth daily.   Yes [provider]  sertraline (ZOLOFT) 50 MG tablet Take 50 mg by mouth at bedtime.  08/01/17  Yes [provider]  HYDROcodone-acetaminophen (NORCO) 5-325 MG tablet Take 1-2 tablets by mouth every 4 (four) hours as needed for moderate pain. Patient not taking: Reported on 09/09/2017 08/26/16   Dereck Leep, MD     VITAL SIGNS:  Blood pressure (!) 178/93, pulse 72, temperature 98 F (36.7 C), temperature source Oral, resp. rate (!) 21, height 5\' 5"  (1.651 m), weight 80.3 kg (177 lb), SpO2 97 %.  PHYSICAL EXAMINATION:  Physical Exam  GENERAL:  83 y.o.-year-old patient lying in the bed with no acute distress.  EYES: Pupils equal, round, reactive to light and accommodation. No scleral icterus. Extraocular muscles intact.  HEENT: Head atraumatic, normocephalic. Oropharynx and nasopharynx clear. No oropharyngeal erythema, moist oral mucosa  NECK:  Supple, no jugular venous distention. No thyroid enlargement, no tenderness.  LUNGS: Normal breath sounds bilaterally, no wheezing, rales, rhonchi. No use of accessory muscles of respiration.  CARDIOVASCULAR: S1, S2 normal. No murmurs, rubs, or gallops.  ABDOMEN: Soft, nontender, nondistended. Bowel sounds present. No organomegaly or mass.  EXTREMITIES: No pedal edema, cyanosis, or clubbing. + 2 pedal & radial pulses b/l.   NEUROLOGIC: Cranial nerves II through XII are intact. No focal Motor or sensory deficits appreciated b/l PSYCHIATRIC: The patient is alert and oriented x 3. Good affect.  SKIN: No obvious rash, lesion, or ulcer.   LABORATORY PANEL:   CBC  Recent Labs Lab 09/09/17 0843  WBC 9.5  HGB 13.7  HCT 39.7  PLT 288   ------------------------------------------------------------------------------------------------------------------  Chemistries    Recent Labs Lab 09/09/17 0843  NA 139  K 3.2*  CL 102  CO2 24  GLUCOSE 141*  BUN 18  CREATININE 0.81  CALCIUM 10.3  AST 36  ALT 17  ALKPHOS 61  BILITOT 0.9   ------------------------------------------------------------------------------------------------------------------  Cardiac Enzymes No results for input(s): TROPONINI in the last 168 hours. ------------------------------------------------------------------------------------------------------------------  RADIOLOGY:  No results found.   IMPRESSION AND PLAN:   * Vertigo Meclizine Fall precautions. Physical therapy consult. Zofran when necessary Likely discharge home tomorrow  * HTN uncontrolled  Restart home meds. Order IV hydralzine.  * DVT prophylaxis with Lovenox  All the records are reviewed and case discussed with ED provider. Management plans discussed with the patient, family and they are in agreement.  CODE STATUS: DNR  TOTAL TIME TAKING CARE OF THIS PATIENT: 35 minutes.   Hillary Bow R M.D on 09/09/2017 at 1:19 PM  Between 7am to 6pm - Pager - [phone]  After 6pm go to [email or web address] - password [email or web address] [facility]  Office  [phone]  CC: Primary care physician; Ezequiel Kayser, MD  Note: This dictation was prepared with Dragon dictation along with smaller phrase technology. Any transcriptional errors that result from this process are unintentional.

## 2017-09-10 DIAGNOSIS — R42 Dizziness and giddiness: Secondary | ICD-10-CM | POA: Diagnosis not present

## 2017-09-10 LAB — BASIC METABOLIC PANEL
ANION GAP: 3 — AB (ref 5–15)
BUN: 15 mg/dL (ref 6–20)
CHLORIDE: 110 mmol/L (ref 101–111)
CO2: 28 mmol/L (ref 22–32)
CREATININE: 0.71 mg/dL (ref 0.44–1.00)
Calcium: 9.1 mg/dL (ref 8.9–10.3)
GFR calc Af Amer: >60 mL/min (ref >60)
GFR calc non Af Amer: >60 mL/min (ref >60)
Glucose, Bld: 106 mg/dL — ABNORMAL HIGH (ref 65–99)
POTASSIUM: 4.1 mmol/L (ref 3.5–5.1)
SODIUM: 141 mmol/L (ref 135–145)

## 2017-09-10 MED ORDER — MECLIZINE HCL 25 MG PO TABS: 25.0000 mg | ORAL_TABLET | Freq: Three times a day (TID) | ORAL | 0 refills | Status: DC | PRN

## 2017-09-10 MED ORDER — ONDANSETRON HCL 4 MG PO TABS: 4.0000 mg | ORAL_TABLET | Freq: Four times a day (QID) | ORAL | 0 refills | Status: DC | PRN

## 2017-09-10 NOTE — Care Management Obs Status (Signed)
[facility] NOTIFICATION   Patient Details  Name: Susan Ayala MRN: [id number] Date of Birth: 1934-07-03   Medicare Observation Status Notification Given:  No (Admitted obs less than 24 hours)    Beverly Sessions, RN 09/10/2017, 11:01 AM

## 2017-09-10 NOTE — Evaluation (Addendum)
Physical Therapy Evaluation Patient Details Name: Susan Ayala MRN: [id number] DOB: September 03, 1934 Today's Date: 09/10/2017   History of Present Illness  Susan Ayala  is a 83 y.o. female with a known history of hypertension, arthritis, vertigo presents to the emergency room with worsening room spinning sensation. She tried Dramamine at home without any help. In the emergency room she was given Ativan and meclizine with some improvement. Pt reports that her symptoms started on Friday and were intermittent all weekend coming in bouts of approximately 20-30 minutes. Her symptoms lasted all day Monday with associated nausea and vomiting. She reports a mild headache but no migraine. Similar episode occurred 3 years ago. Pt reports tinnitus but denies hearing loss with episodes. She states that her symptoms are worsened with movement and improved after her vomiting. She has never seen an ENT physician in the past or given a definitive diagnosis for her symptoms.   Clinical Impression  Pt admitted with above diagnosis. Pt currently with functional limitations due to the deficits listed below (see PT Problem List).  PT examination reveals full strength and sensation bilaterally. Pronator drift, rapid alternating movements, finger to nose, and heel to shin all normal bilaterally. Full facial strength throughout and no facial droop noted. Patient with midrange right horizontal beating nystagmus with right gaze, absent both at central and left gaze. Extra ocular motion intact with slight decrease in vertical gaze noted bilaterally. Smooth pursuits are mildly saccadic throughout, but horizontal saccade testing is normal. VOR and VOR cancellation negative. Pt with positive L head VOR thrust and negative R thrust. Denies hearing loss but reports tinnitus bilaterally. However she appears to have some hearing loss with questioning during examination. Dix-Hallpike testing negative bilaterally. 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 Would need hearing test to differentiate. Meniere's disease is also a possibility given prior episode three years ago however duration of her symptoms is outside the typical time frame for Meniere's and pt not complaining of hearing loss. She would benefit from a referral to ENT for hearing test and possibly VNG study to further clarify the etiology of her symptoms. Pending further study and progression of symptoms she might benefit from outpatient vestibular physical therapy. With respect to her mobility pt is safe with bed mobility, transfers, and ambulation. She is able to walk a full lap around the nurse's station without an assistive device. She demonstrates moderate staggering and slowing of gait with horizontal and vertical head turns. Able to perform gait speech changes without loss of balance. Denies DOE. Encouraged use of single point cane vs rolling walker at discharge until she feels fully stable with walking. No DME needs a this time. Pt is safe to discharge home when medically appropriate. Pt will benefit from PT services during admission to address deficits in balance, and mobility in order to return to full function at home.     Follow Up Recommendations Outpatient PT;Other (comment) (Vestibular therapy if symptoms don't resolve)    Equipment Recommendations  Other (comment);None recommended by PT (Encouraged walker vs cane pending stability at home)    Recommendations for Other Services       Precautions / Restrictions Precautions Precautions: Fall Restrictions Weight Bearing Restrictions: No      Mobility  Bed Mobility Overal bed mobility: Independent             General bed mobility comments: Fair speed/sequencing  Transfers Overall transfer level: Independent Equipment used: None  General transfer comment: Fair speed, sequencing, and  stability  Ambulation/Gait Ambulation/Gait assistance: Min guard Ambulation Distance (Feet): 200 Feet Assistive device: None Gait Pattern/deviations: Decreased step length - right;Decreased step length - left Gait velocity: WFL Gait velocity interpretation: at or above normal speed for age/gender General Gait Details: patient is able to walk a full lap around the nurse's station without an assistive device. She demonstrates moderate staggering and slowing of gait with horizontal and vertical head turns. Able to perform gait speech changes without loss of balance. Denies DOE.  Stairs            Wheelchair Mobility    Modified Rankin (Stroke Patients Only)       Balance Overall balance assessment: Needs assistance Sitting-balance support: No upper extremity supported Sitting balance-Leahy Scale: Good     Standing balance support: No upper extremity supported Standing balance-Leahy Scale: Fair                               Pertinent Vitals/Pain Pain Assessment: No/denies pain    Home Living Family/patient expects to be discharged to:: Private residence Living Arrangements: Alone Available Help at Discharge: Friend(s) Type of Home: House Home Access: Stairs to enter Entrance Stairs-Rails: Right Entrance Stairs-Number of Steps: 1 Home Layout: Multi-level;Other (Comment) (Pt has a chair lift at each of her stairs inside) Home Equipment: Walker - 2 wheels;Cane - single point;Shower seat;Grab bars - tub/shower      Prior Function Level of Independence: Independent         Comments: Independent with ADLs/IADLs. Full community ambulator without assistive device. Drives. No falls. Volunteers regularly     Hand Dominance   Dominant Hand: Right    Extremity/Trunk Assessment   Upper Extremity Assessment Upper Extremity Assessment: Overall WFL for tasks assessed    Lower Extremity Assessment Lower Extremity Assessment: Overall WFL for tasks  assessed       Communication   Communication: No difficulties  Cognition Arousal/Alertness: Awake/alert Behavior During Therapy: WFL for tasks assessed/performed Overall Cognitive Status: Within Functional Limits for tasks assessed                                        General Comments      Exercises     Assessment/Plan    PT Assessment Patient needs continued PT services  PT Problem List Decreased balance;Decreased mobility       PT Treatment Interventions DME instruction;Gait training;Stair training;Functional mobility training;Therapeutic activities;Therapeutic exercise;Balance training;Neuromuscular re-education;Patient/family education;Other (comment) (Vestibular therapy)    PT Goals (Current goals can be found in the Care Plan section)  Acute Rehab PT Goals Patient Stated Goal: Return to prior function at home PT Goal Formulation: With patient Time For Goal Achievement: 09/24/17 Potential to Achieve Goals: Good    Frequency Min 2X/week   Barriers to discharge Decreased caregiver support Lives alone    Co-evaluation               AM-PAC PT "6 Clicks" Daily Activity  Outcome Measure Difficulty turning over in bed (including adjusting bedclothes, sheets and blankets)?: None Difficulty moving from lying on back to sitting on the side of the bed? : None Difficulty sitting down on and standing up from a chair with arms (e.g., wheelchair, bedside commode, etc,.)?: None Help needed moving to and from a bed to chair (including  a wheelchair)?: None Help needed walking in hospital room?: None Help needed climbing 3-5 steps with a railing? : A Little 6 Click Score: 23    End of Session Equipment Utilized During Treatment: Gait belt Activity Tolerance: Patient tolerated treatment well Patient left: in bed;with call bell/phone within reach;with bed alarm set   PT Visit Diagnosis: Unsteadiness on feet (R26.81);Dizziness and giddiness (R42)     Time: 1601-0932 PT Time Calculation (min) (ACUTE ONLY): 38 min   Charges:   PT Evaluation $PT Eval Moderate Complexity: 1 Mod PT Treatments $Therapeutic Activity: 8-22 mins   PT G Codes:   PT G-Codes **NOT FOR INPATIENT CLASS** Functional Assessment Tool Used: AM-PAC 6 Clicks Basic Mobility Functional Limitation: Mobility: Walking and moving around Mobility: Walking and Moving Around Current Status (T5573): At least 1 percent but less than 20 percent impaired, limited or restricted Mobility: Walking and Moving Around Goal Status [phone]): 0 percent impaired, limited or restricted    Phillips Grout PT, DPT    Nesreen Albano 09/10/2017, 9:36 AM

## 2017-09-10 NOTE — Discharge Summary (Signed)
[facility] NAME: Susan Ayala    MR#:  [id number]  DATE OF BIRTH:  [birth year]  DATE OF ADMISSION:  09/09/2017 ADMITTING PHYSICIAN: Hillary Bow, MD  DATE OF DISCHARGE: 09/10/2017 12:44 PM  PRIMARY CARE PHYSICIAN: Ezequiel Kayser, MD    ADMISSION DIAGNOSIS:  Vertigo [R42]  DISCHARGE DIAGNOSIS:  Active Problems:   Vertigo   SECONDARY DIAGNOSIS:   Past Medical History:  Diagnosis Date  . Arthritis   . Depression   . Hypercholesterolemia   . Hypertension   . Osteoporosis   . PONV (postoperative nausea and vomiting)     HOSPITAL COURSE:   83 year old female with past medical history of hypertension, hyperlipidemia, depression, osteoarthritis, osteoporosis and previous history of vertigo who presented to the hospital with nausea vomiting and dizziness.  1. Vertigo-this was the cause of patient's nausea vomiting and dizziness. Patient was treated supportively with meclizine, antibiotics and IV fluids. Overnight patient's clinical symptoms have significantly improved. Her nausea has resolved, her dizziness is improved. -She is being discharged home on oral meclizine and Zofran. She was also referred to ENT as an outpatient.  2. Essential hypertension-patient will continue hydrochlorothiazide  3. Depression-patient will continue her Zoloft.  4. Hyperlipidemia-patient will continue her lovastatin.  DISCHARGE CONDITIONS:   Stable  CONSULTS OBTAINED:    DRUG ALLERGIES:   Allergies  Allergen Reactions  . Codeine Nausea And Vomiting  . Adhesive [Tape] Itching and Rash    On tender skin.     DISCHARGE MEDICATIONS:   Allergies as of 09/10/2017      Reactions   Codeine Nausea And Vomiting   Adhesive [tape] Itching, Rash   On tender skin.      Medication List    STOP taking these medications   HYDROcodone-acetaminophen 5-325 MG tablet Commonly known as:  NORCO     TAKE these medications   aspirin 81 MG  chewable tablet Chew 81 mg by mouth daily.   CALCIUM 600+D 600-800 MG-UNIT Tabs Generic drug:  Calcium Carb-Cholecalciferol Take 1 tablet by mouth daily.   [employer] IBUPROFEN PM PO Take 1 tablet by mouth at bedtime as needed.   hydrochlorothiazide 25 MG tablet Commonly known as:  HYDRODIURIL Take 25 mg by mouth every morning.   lovastatin 40 MG tablet Commonly known as:  MEVACOR Take 40 mg by mouth at bedtime.   meclizine 25 MG tablet Commonly known as:  ANTIVERT Take 1 tablet (25 mg total) by mouth 3 (three) times daily as needed for dizziness.   MULTI-VITAMIN/MINERALS PO Take 1 tablet by mouth daily.   ondansetron 4 MG tablet Commonly known as:  ZOFRAN Take 1 tablet (4 mg total) by mouth every 6 (six) hours as needed for nausea.   potassium chloride 10 MEQ CR capsule Commonly known as:  MICRO-K Take 20 mEq by mouth daily.   sertraline 50 MG tablet Commonly known as:  ZOLOFT Take 50 mg by mouth at bedtime.   Vitamin D3 1000 units Caps Take 1 capsule by mouth daily.            Discharge Care Instructions        Start     Ordered   09/10/17 0000  ondansetron (ZOFRAN) 4 MG tablet  Every 6 hours PRN     09/10/17 1053   09/10/17 0000  meclizine (ANTIVERT) 25 MG tablet  3 times daily PRN     09/10/17 1053   09/10/17 0000  Activity as tolerated -  No restrictions     09/10/17 1053   09/10/17 0000  Diet - low sodium heart healthy     09/10/17 1053        DISCHARGE INSTRUCTIONS:   DIET:  Cardiac diet  DISCHARGE CONDITION:  Stable  ACTIVITY:  Activity as tolerated  OXYGEN:  Home Oxygen: No.   Oxygen Delivery: room air  DISCHARGE LOCATION:  home   If you experience worsening of your admission symptoms, develop shortness of breath, life threatening emergency, suicidal or homicidal thoughts you must seek medical attention immediately by calling 911 or calling your MD immediately  if symptoms less severe.  You Must read complete instructions/literature  along with all the possible adverse reactions/side effects for all the Medicines you take and that have been prescribed to you. Take any new Medicines after you have completely understood and accpet all the possible adverse reactions/side effects.   Please note  You were cared for by a hospitalist during your hospital stay. If you have any questions about your discharge medications or the care you received while you were in the hospital after you are discharged, you can call the unit and asked to speak with the hospitalist on call if the hospitalist that took care of you is not available. Once you are discharged, your primary care physician will handle any further medical issues. Please note that NO REFILLS for any discharge medications will be authorized once you are discharged, as it is imperative that you return to your primary care physician (or establish a relationship with a primary care physician if you do not have one) for your aftercare needs so that they can reassess your need for medications and monitor your lab values.     Today   Dizziness resolved, no nausea, vomiting. Elevated well with physical therapy without any evidence of vertigo.  VITAL SIGNS:  Blood pressure 136/70, pulse 81, temperature 97.8 F (36.6 C), temperature source Oral, resp. rate 18, height 5\' 5"  (1.651 m), weight 80.3 kg (177 lb), SpO2 98 %.  I/O:   Intake/Output Summary (Last 24 hours) at 09/10/17 1551 Last data filed at 09/10/17 1020  Gross per 24 hour  Intake             1485 ml  Output              600 ml  Net              885 ml    PHYSICAL EXAMINATION:  GENERAL:  83 y.o.-year-old patient lying in the bed with no acute distress.  EYES: Pupils equal, round, reactive to light and accommodation. No scleral icterus. Extraocular muscles intact.  HEENT: Head atraumatic, normocephalic. Oropharynx and nasopharynx clear.  NECK:  Supple, no jugular venous distention. No thyroid enlargement, no tenderness.   LUNGS: Normal breath sounds bilaterally, no wheezing, rales,rhonchi. No use of accessory muscles of respiration.  CARDIOVASCULAR: S1, S2 normal. No murmurs, rubs, or gallops.  ABDOMEN: Soft, non-tender, non-distended. Bowel sounds present. No organomegaly or mass.  EXTREMITIES: No pedal edema, cyanosis, or clubbing.  NEUROLOGIC: Cranial nerves II through XII are intact. No focal motor or sensory defecits b/l.  PSYCHIATRIC: The patient is alert and oriented x 3. Good affect.  SKIN: No obvious rash, lesion, or ulcer.   DATA REVIEW:   CBC  Recent Labs Lab 09/09/17 0843  WBC 9.5  HGB 13.7  HCT 39.7  PLT 288    Chemistries   Recent Labs Lab 09/09/17 0843 09/10/17 0451  NA 139 141  K 3.2* 4.1  CL 102 110  CO2 24 28  GLUCOSE 141* 106*  BUN 18 15  CREATININE 0.81 0.71  CALCIUM 10.3 9.1  AST 36  --   ALT 17  --   ALKPHOS 61  --   BILITOT 0.9  --     Cardiac Enzymes No results for input(s): TROPONINI in the last 168 hours.  Microbiology Results  No results found for this or any previous visit.  RADIOLOGY:  No results found.    Management plans discussed with the patient, family and they are in agreement.  CODE STATUS:  Code Status History    Date Active Date Inactive Code Status Order ID Comments User Context   09/09/2017  1:16 PM 09/10/2017  3:49 PM DNR [id number]  Hillary Bow, MD ED   08/26/2016  7:19 PM 08/26/2016 10:20 PM Full Code [id number]  Dereck Leep, MD Inpatient    Questions for Most Recent Historical Code Status (Order [id number])    Question Answer Comment   In the event of cardiac or respiratory ARREST Do not call a "code blue"    In the event of cardiac or respiratory ARREST Do not perform Intubation, CPR, defibrillation or ACLS    In the event of cardiac or respiratory ARREST Use medication by any route, position, wound care, and other measures to relive pain and suffering. May use oxygen, suction and manual treatment of airway obstruction as  needed for comfort.         Advance Directive Documentation     Most Recent Value  Type of Advance Directive  Living will  Pre-existing out of facility DNR order (yellow form or pink MOST form)  -  "MOST" Form in Place?  -      TOTAL TIME TAKING CARE OF THIS PATIENT: 40 minutes.    Henreitta Leber M.D on 09/10/2017 at 3:51 PM  Between 7am to 6pm - Pager - [phone]  After 6pm go to [email or web address] - [employer] [facility]  Office  [phone]  CC: Primary care physician; Ezequiel Kayser, MD

## 2017-09-10 NOTE — Progress Notes (Signed)
Pt discharged per MD order. IV removed. Discharge paperwork reviewed with pt. Prescription given to pt. Pt taken to car via wheelchair.

## 2017-11-12 DIAGNOSIS — E876 Hypokalemia: Secondary | ICD-10-CM | POA: Insufficient documentation

## 2017-11-12 DIAGNOSIS — R7302 Impaired glucose tolerance (oral): Secondary | ICD-10-CM | POA: Insufficient documentation

## 2018-02-16 ENCOUNTER — Other Ambulatory Visit: Payer: Self-pay

## 2018-02-16 ENCOUNTER — Encounter: Payer: Self-pay | Admitting: *Deleted

## 2018-02-16 ENCOUNTER — Ambulatory Visit
Admission: EM | Admit: 2018-02-16 | Discharge: 2018-02-16 | Disposition: A | Discharge status: Home / Self Care | Payer: Medicare Other | Attending: Family Medicine | Admitting: Family Medicine

## 2018-02-16 DIAGNOSIS — R05 Cough: Secondary | ICD-10-CM | POA: Diagnosis present

## 2018-02-16 DIAGNOSIS — I10 Essential (primary) hypertension: Secondary | ICD-10-CM | POA: Insufficient documentation

## 2018-02-16 DIAGNOSIS — M810 Age-related osteoporosis without current pathological fracture: Secondary | ICD-10-CM | POA: Diagnosis not present

## 2018-02-16 DIAGNOSIS — E7800 Pure hypercholesterolemia, unspecified: Secondary | ICD-10-CM | POA: Insufficient documentation

## 2018-02-16 DIAGNOSIS — Z96641 Presence of right artificial hip joint: Secondary | ICD-10-CM | POA: Diagnosis not present

## 2018-02-16 DIAGNOSIS — Z79899 Other long term (current) drug therapy: Secondary | ICD-10-CM | POA: Diagnosis not present

## 2018-02-16 DIAGNOSIS — M1990 Unspecified osteoarthritis, unspecified site: Secondary | ICD-10-CM | POA: Insufficient documentation

## 2018-02-16 DIAGNOSIS — Z7982 Long term (current) use of aspirin: Secondary | ICD-10-CM | POA: Insufficient documentation

## 2018-02-16 DIAGNOSIS — J0100 Acute maxillary sinusitis, unspecified: Secondary | ICD-10-CM | POA: Diagnosis not present

## 2018-02-16 DIAGNOSIS — F329 Major depressive disorder, single episode, unspecified: Secondary | ICD-10-CM | POA: Insufficient documentation

## 2018-02-16 DIAGNOSIS — J029 Acute pharyngitis, unspecified: Secondary | ICD-10-CM | POA: Diagnosis present

## 2018-02-16 LAB — RAPID STREP SCREEN (MED CTR MEBANE ONLY): Streptococcus, Group A Screen (Direct): NEGATIVE

## 2018-02-16 MED ORDER — AMOXICILLIN-POT CLAVULANATE 875-125 MG PO TABS: 1.0000 | ORAL_TABLET | Freq: Two times a day (BID) | ORAL | 0 refills | Status: DC

## 2018-02-16 NOTE — ED Triage Notes (Signed)
Patient started having symptoms of cough, nasal congestion, and sore throat 3 days ago.

## 2018-02-16 NOTE — ED Provider Notes (Signed)
[facility]  CSN: [id number] Arrival date & time: 02/16/18  1108  History   Chief Complaint Chief Complaint  Patient presents with  . Sore Throat  . Cough   HPI  84 year old female presents with the above complaints.  Patient states that she has been sick for the past 3 days.  She reports that she initially started with sore throat but this has resolved.  She is now plagued by cough and congestion.  Also reports sinus pain and pressure.  She has had no relief with Flonase, Nettie pot, Allegra, and ibuprofen.  No known exacerbating factors.  No reported sick contacts.  Associated subjective fever.  No other complaints.  Past Medical History:  Diagnosis Date  . Arthritis   . Depression   . Hypercholesterolemia   . Hypertension   . Osteoporosis   . PONV (postoperative nausea and vomiting)     Patient Active Problem List   Diagnosis Date Noted  . Vertigo 09/09/2017    Past Surgical History:  Procedure Laterality Date  . ABDOMINAL HYSTERECTOMY    . BREAST SURGERY Bilateral 1993   biopsy negative  . CATARACT EXTRACTION, BILATERAL Bilateral 2013   eyes done 30 days apart  . 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 Right total hip arthroplasty  02/16/2014  . TONSILLECTOMY      OB History    No data available       Home Medications    Prior to Admission medications   Medication Sig Start Date End Date Taking? Authorizing Provider  aspirin 81 MG chewable tablet Chew 81 mg by mouth daily.   Yes [provider]  Calcium Carb-Cholecalciferol (CALCIUM 600+D) 600-800 MG-UNIT TABS Take 1 tablet by mouth daily.   Yes [provider]  potassium chloride (MICRO-K) 10 MEQ CR capsule Take 20 mEq by mouth daily.    Yes [provider]  sertraline (ZOLOFT) 50 MG tablet Take 50 mg by mouth at bedtime.  08/01/17  Yes [provider]  amoxicillin-clavulanate (AUGMENTIN) 875-125 MG tablet Take 1 tablet by mouth every 12 (twelve) hours. 02/16/18   Coral Spikes, DO  Cholecalciferol (VITAMIN D3) 1000 units CAPS Take 1 capsule by mouth daily.    [provider]  hydrochlorothiazide (HYDRODIURIL) 25 MG tablet Take 25 mg by mouth every morning.    [provider]  Ibuprofen-Diphenhydramine Cit ([employer] IBUPROFEN PM PO) Take 1 tablet by mouth at bedtime as needed.    [provider]  lovastatin (MEVACOR) 40 MG tablet Take 40 mg by mouth at bedtime.    [provider]  meclizine (ANTIVERT) 25 MG tablet Take 1 tablet (25 mg total) by mouth 3 (three) times daily as needed for dizziness. 09/10/17   Henreitta Leber, MD  Multiple Vitamins-Minerals (MULTI-VITAMIN/MINERALS PO) Take 1 tablet by mouth daily.     [provider]  ondansetron (ZOFRAN) 4 MG tablet Take 1 tablet (4 mg total) by mouth every 6 (six) hours as needed for nausea. 09/10/17   Henreitta Leber, MD    Family History Family History  Problem Relation Age of Onset  . Colon cancer Father     Social History Social History   Tobacco Use  . Smoking status: Never Smoker  . Smokeless tobacco: Never  Used  Substance Use Topics  . Alcohol use: Yes    Alcohol/week: 0.0 - 0.6 oz    Comment: rare, 1-2 per month  . Drug use: No     Allergies   Codeine and Adhesive [tape]   Review of Systems Review of Systems  Constitutional: Positive for fever.  HENT: Positive for congestion, sinus pressure and sinus pain.   Respiratory: Positive for cough.    Physical Exam Triage Vital Signs ED Triage Vitals  Enc Vitals Group     BP 02/16/18 1141 127/71     Pulse Rate 02/16/18 1141 84     Resp 02/16/18 1141 16     Temp 02/16/18 1141 98 F (36.7 C)     Temp Source 02/16/18 1141 Oral     SpO2 02/16/18  1141 98 %     Weight 02/16/18 1143 165 lb (74.8 kg)     Height 02/16/18 1143 5' 5.5" (1.664 m)     Head Circumference --      Peak Flow --      Pain Score 02/16/18 1143 0     Pain Loc --      Pain Edu? --      Excl. in [place]? --    Updated Vital Signs BP 127/71 (BP Location: Left Arm)   Pulse 84   Temp 98 F (36.7 C) (Oral)   Resp 16   Ht 5' 5.5" (1.664 m)   Wt 165 lb (74.8 kg)   SpO2 98%   BMI 27.04 kg/m     Physical Exam  Constitutional: She is oriented to person, place, and time. She appears well-developed. No distress.  HENT:  Head: Normocephalic and atraumatic.  Mouth/Throat: Oropharynx is clear and moist.  Maxillary sinus tenderness to palpation, left greater than right.  Cardiovascular: Normal rate and regular rhythm.  Pulmonary/Chest: Effort normal and breath sounds normal. She has no wheezes. She has no rales.  Neurological: She is alert and oriented to person, place, and time.  Psychiatric: She has a normal mood and affect. Her behavior is normal.  Nursing note and vitals reviewed.  UC Treatments / Results  Labs (all labs ordered are listed, but only abnormal results are displayed) Labs Reviewed  RAPID STREP SCREEN (NOT AT [facility])  CULTURE, GROUP A STREP [facility])    EKG  EKG Interpretation None       Radiology No results found.  Procedures Procedures (including critical care time)  Medications Ordered in UC Medications - No data to display   Initial Impression / Assessment and Plan / UC Course  I have reviewed the triage vital signs and the nursing notes.  Pertinent labs & imaging results that were available during my care of the patient were reviewed by me and considered in my medical decision making (see chart for details).     84 year old female presents with sinusitis.  Treating with Augmentin.  Final Clinical Impressions(s) / UC Diagnoses   Final diagnoses:  Acute maxillary sinusitis, recurrence not specified    ED Discharge Orders          Ordered    amoxicillin-clavulanate (AUGMENTIN) 875-125 MG tablet  Every 12 hours     02/16/18 1240     Controlled Substance Prescriptions  Controlled Substance Registry consulted? Not Applicable   Coral Spikes, DO 02/16/18 1309

## 2018-02-18 LAB — CULTURE, GROUP A STREP (THRC)

## 2018-04-11 ENCOUNTER — Other Ambulatory Visit: Payer: Self-pay

## 2018-04-11 ENCOUNTER — Ambulatory Visit
Admission: EM | Admit: 2018-04-11 | Discharge: 2018-04-11 | Disposition: A | Discharge status: Home / Self Care | Payer: Medicare Other | Attending: Family Medicine | Admitting: Family Medicine

## 2018-04-11 DIAGNOSIS — N3001 Acute cystitis with hematuria: Secondary | ICD-10-CM | POA: Diagnosis not present

## 2018-04-11 DIAGNOSIS — Z7982 Long term (current) use of aspirin: Secondary | ICD-10-CM | POA: Insufficient documentation

## 2018-04-11 DIAGNOSIS — R300 Dysuria: Secondary | ICD-10-CM | POA: Diagnosis present

## 2018-04-11 DIAGNOSIS — Z79899 Other long term (current) drug therapy: Secondary | ICD-10-CM | POA: Insufficient documentation

## 2018-04-11 LAB — URINALYSIS, COMPLETE (UACMP) WITH MICROSCOPIC
BILIRUBIN URINE: NEGATIVE
Glucose, UA: 100 mg/dL — AB
Ketones, ur: NEGATIVE mg/dL
Nitrite: POSITIVE — AB
PROTEIN: 30 mg/dL — AB
SPECIFIC GRAVITY, URINE: 1.015 (ref 1.005–1.030)
Squamous Epithelial / LPF: NONE SEEN (ref 0–5)
pH: 8.5 — ABNORMAL HIGH (ref 5.0–8.0)

## 2018-04-11 MED ORDER — SULFAMETHOXAZOLE-TRIMETHOPRIM 800-160 MG PO TABS: 1.0000 | ORAL_TABLET | Freq: Two times a day (BID) | ORAL | 0 refills | Status: DC

## 2018-04-11 NOTE — ED Triage Notes (Signed)
S/s are urgency, pelvic pressure, dysuria. Pt has tried Azo, with relief.

## 2018-04-11 NOTE — ED Provider Notes (Signed)
[facility]    CSN: [id number] Arrival date & time: 04/11/18  5625     History   Chief Complaint Chief Complaint  Patient presents with  . Urinary Tract Infection    HPI Susan Ayala is a 84 y.o. female.   Presents to the urgent care facility for evaluation of urinary urgency, pelvic pressure and dysuria.  Patient has had symptoms for 1 day.  She is tried Azo with some relief.  She denies any fevers, back pain, nausea, vomiting.  No recent UTIs in the past.  She denies any current pain or discomfort.  HPI  Past Medical History:  Diagnosis Date  . Arthritis   . Depression   . Hypercholesterolemia   . Hypertension   . Osteoporosis   . PONV (postoperative nausea and vomiting)     Patient Active Problem List   Diagnosis Date Noted  . Vertigo 09/09/2017    Past Surgical History:  Procedure Laterality Date  . ABDOMINAL HYSTERECTOMY    . BREAST SURGERY Bilateral 1993   biopsy negative  . CATARACT EXTRACTION, BILATERAL Bilateral 2013   eyes done 30 days apart  . 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 Right total hip arthroplasty  02/16/2014  . TONSILLECTOMY      OB History   None      Home Medications    Prior to Admission medications   Medication Sig Start Date End Date Taking? Authorizing Provider  aspirin 81 MG chewable tablet Chew 81 mg by mouth daily.   Yes [provider]  Calcium Carb-Cholecalciferol (CALCIUM 600+D) 600-800 MG-UNIT TABS Take 1 tablet by mouth daily.   Yes [provider]  Cholecalciferol (VITAMIN D3) 1000 units CAPS Take 1 capsule by mouth daily.   Yes [provider]  hydrochlorothiazide (HYDRODIURIL) 25 MG tablet Take 25 mg by mouth every morning.   Yes  [provider]  Ibuprofen-Diphenhydramine Cit ([employer] IBUPROFEN PM PO) Take 1 tablet by mouth at bedtime as needed.   Yes [provider]  lovastatin (MEVACOR) 40 MG tablet Take 40 mg by mouth at bedtime.   Yes [provider]  Multiple Vitamins-Minerals (MULTI-VITAMIN/MINERALS PO) Take 1 tablet by mouth daily.    Yes [provider]  potassium chloride (MICRO-K) 10 MEQ CR capsule Take 20 mEq by mouth daily.   Yes [provider]  sertraline (ZOLOFT) 50 MG tablet Take 50 mg by mouth at bedtime.  08/01/17  Yes [provider]  amoxicillin-clavulanate (AUGMENTIN) 875-125 MG tablet Take 1 tablet by mouth every 12 (twelve) hours. 02/16/18   Coral Spikes, DO  meclizine (ANTIVERT) 25 MG tablet Take 1 tablet (25 mg total) by mouth 3 (three) times daily as needed for dizziness. 09/10/17   Henreitta Leber, MD  ondansetron (ZOFRAN) 4 MG tablet Take 1 tablet (4 mg total) by mouth every 6 (six) hours as needed for nausea. 09/10/17   Henreitta Leber, MD  sulfamethoxazole-trimethoprim (BACTRIM DS,SEPTRA DS) 800-160 MG tablet Take 1 tablet by mouth 2 (two) times daily. 04/11/18   Duanne Guess, PA-C    Family History Family History  Problem Relation Age of Onset  . Colon cancer Father     Social History Social History   Tobacco Use  .  Smoking status: Never Smoker  . Smokeless tobacco: Never Used  Substance Use Topics  . Alcohol use: Yes    Alcohol/week: 0.0 - 0.6 oz    Comment: rare, 1-2 per month  . Drug use: No     Allergies   Codeine; Adhesive [tape]; and Zolpidem   Review of Systems Review of Systems  Constitutional: Negative for fever.  Respiratory: Negative for shortness of breath.   Cardiovascular: Negative for chest pain.  Gastrointestinal: Negative for abdominal pain.  Genitourinary: Positive for dysuria, frequency and urgency. Negative for difficulty urinating, hematuria, pelvic pain and vaginal discharge.  Musculoskeletal:  Negative for back pain and myalgias.  Skin: Negative for rash.  Neurological: Negative for dizziness and headaches.     Physical Exam Triage Vital Signs ED Triage Vitals  Enc Vitals Group     BP 04/11/18 0813 (!) 152/78     Pulse Rate 04/11/18 0813 92     Resp 04/11/18 0813 16     Temp 04/11/18 0813 97.8 F (36.6 C)     Temp Source 04/11/18 0813 Oral     SpO2 04/11/18 0813 97 %     Weight 04/11/18 0814 165 lb (74.8 kg)     Height 04/11/18 0814 5' 5.5" (1.664 m)     Head Circumference --      Peak Flow --      Pain Score 04/11/18 0813 0     Pain Loc --      Pain Edu? --      Excl. in [place]? --    No data found.  Updated Vital Signs BP (!) 152/78 (BP Location: Left Arm)   Pulse 92   Temp 97.8 F (36.6 C) (Oral)   Resp 16   Ht 5' 5.5" (1.664 m)   Wt 165 lb (74.8 kg)   SpO2 97%   BMI 27.04 kg/m   Visual Acuity Right Eye Distance:   Left Eye Distance:   Bilateral Distance:    Right Eye Near:   Left Eye Near:    Bilateral Near:     Physical Exam  Constitutional: She is oriented to person, place, and time. She appears well-developed and well-nourished. No distress.  HENT:  Head: Normocephalic and atraumatic.  Eyes: Conjunctivae are normal. Right eye exhibits no discharge. Left eye exhibits no discharge.  Neck: Normal range of motion.  Cardiovascular: Normal rate.  Pulmonary/Chest: No respiratory distress.  Abdominal: Soft. She exhibits no distension. There is no tenderness. There is no guarding.  No CVA tenderness  Musculoskeletal: Normal range of motion. She exhibits no deformity.  Neurological: She is alert and oriented to person, place, and time. She has normal reflexes.  Skin: Skin is warm and dry.  Psychiatric: She has a normal mood and affect. Her behavior is normal. Thought content normal.     UC Treatments / Results  Labs (all labs ordered are listed, but only abnormal results are displayed) Labs Reviewed  URINALYSIS, COMPLETE (UACMP) WITH  MICROSCOPIC - Abnormal; Notable for the following components:      Result Value   pH 8.5 (*)    Glucose, UA 100 (*)    Hgb urine dipstick LARGE (*)    Protein, ur 30 (*)    Nitrite POSITIVE (*)    Leukocytes, UA LARGE (*)    Bacteria, UA FEW (*)    All other components within normal limits    EKG None Radiology No results found.  Procedures Procedures (including critical care time)  Medications  Ordered in UC Medications - No data to display   Initial Impression / Assessment and Plan / UC Course  I have reviewed the triage vital signs and the nursing notes.  Pertinent labs & imaging results that were available during my care of the patient were reviewed by me and considered in my medical decision making (see chart for details).     84 year old female with acute UTI symptoms.  Urinalysis ordered and reviewed by me today shows acute UTI.  She has positive for bacteria, leukocytes, nitrites with elevated white blood cell count as well as hematuria.  Patient is placed on Bactrim DS.  She will increase fluids.  She is educated on signs and symptoms return to the clinic for.  Final Clinical Impressions(s) / UC Diagnoses   Final diagnoses:  Acute cystitis with hematuria    ED Discharge Orders        Ordered    sulfamethoxazole-trimethoprim (BACTRIM DS,SEPTRA DS) 800-160 MG tablet  2 times daily     04/11/18 0903       Duanne Guess, PA-C 04/11/18 [id number]

## 2018-04-11 NOTE — Discharge Instructions (Addendum)
Please drink lots of fluids.  Take antibiotics as prescribed.  Return to the ER for any back pain, fevers, worsening symptoms or urgent changes in your health.

## 2020-11-25 DIAGNOSIS — M1612 Unilateral primary osteoarthritis, left hip: Secondary | ICD-10-CM | POA: Insufficient documentation

## 2021-01-31 NOTE — Discharge Instructions (Signed)
Instructions after Total Hip Replacement     Martyn Timme P. Calem Cocozza, Jr., M.D.     Dept. of Orthopaedics & Sports Medicine  [facility]  [address]  [place], [place]  [address]  Phone: [phone]   Fax: [phone]    DIET: . Drink plenty of non-alcoholic fluids. . Resume your normal diet. Include foods high in fiber.  ACTIVITY:  . You may use crutches or a walker with weight-bearing as tolerated, unless instructed otherwise. . You may be weaned off of the walker or crutches by your Physical Therapist.  . Do NOT reach below the level of your knees or cross your legs until allowed.    . Continue doing gentle exercises. Exercising will reduce the pain and swelling, increase motion, and prevent muscle weakness.   . Please continue to use the TED compression stockings for 6 weeks. You may remove the stockings at night, but should reapply them in the morning. . Do not drive or operate any equipment until instructed.  WOUND CARE:  . Continue to use ice packs periodically to reduce pain and swelling. . Keep the incision clean and dry. . You may bathe or shower after the staples are removed at the first office visit following surgery.  MEDICATIONS: . You may resume your regular medications. . Please take the pain medication as prescribed on the medication. . Do not take pain medication on an empty stomach. . You have been given a prescription for a blood thinner to prevent blood clots. Please take the medication as instructed. (NOTE: After completing a 2 week course of Lovenox, take one Enteric-coated aspirin once a day.) . Pain medications and iron supplements can cause constipation. Use a stool softener (Senokot or Colace) on a daily basis and a laxative (dulcolax or miralax) as needed. . Do not drive or drink alcoholic beverages when taking pain medications.  CALL THE OFFICE FOR: . Temperature above 101 degrees . Excessive bleeding or drainage on the dressing. . Excessive  swelling, coldness, or paleness of the toes. . Persistent nausea and vomiting.  FOLLOW-UP:  . You should have an appointment to return to the office in 6 weeks after surgery. . Arrangements have been made for continuation of Physical Therapy (either home therapy or outpatient therapy).     [facility] Department Directory         [email or web address]       [email or web address]          Cardiology  Appointments: [place] - [phone] [place] - [phone]  Endocrinology  Appointments: [place] - [phone] [place] - [phone]  Gastroenterology  Appointments: [place] - [phone] [place] - [phone]        General Surgery   Appointments: [place] - [phone]  Internal Medicine/Family Medicine  Appointments: [place] - [phone] [place] - [phone] [place] - [phone]  Metabolic and Weigh Loss Surgery  Appointments: [place] - [phone]        Neurology  Appointments: [place] - [phone] [place] - [phone]  Neurosurgery  Appointments: [place] - [phone]  Obstetrics & Gynecology  Appointments: [place] - [phone] [place] - [phone]        Pediatrics  Appointments: [place] - [phone] [place] - [phone]  Physiatry  Appointments: [place] -[phone]  Physical Therapy  Appointments: [place] - [phone] [place] - [phone]        Podiatry  Appointments: [place] - [phone] [place] - [phone]  Pulmonology  Appointments: [place] - [phone]  Rheumatology  Appointments: [place] - [phone]         Location: [facility]  [address] [place], [place]  [address]  [place] Location: [facility] [address] [place], [place]  [address]  [place] Location: [facility] [address] [place], [place]  [address]    

## 2021-02-01 ENCOUNTER — Other Ambulatory Visit: Payer: Self-pay

## 2021-02-01 ENCOUNTER — Encounter
Admission: RE | Admit: 2021-02-01 | Discharge: 2021-02-01 | Disposition: A | Discharge status: Home / Self Care | Payer: Medicare Other | Source: Ambulatory Visit | Attending: Orthopedic Surgery | Admitting: Orthopedic Surgery

## 2021-02-01 DIAGNOSIS — Z01818 Encounter for other preprocedural examination: Secondary | ICD-10-CM | POA: Diagnosis present

## 2021-02-01 HISTORY — DX: Gastro-esophageal reflux disease without esophagitis: K21.9

## 2021-02-01 HISTORY — DX: Pneumonia, unspecified organism: J18.9

## 2021-02-01 HISTORY — DX: Anemia, unspecified: D64.9

## 2021-02-01 LAB — CBC WITH DIFFERENTIAL/PLATELET
Abs Immature Granulocytes: 0.01 10*3/uL (ref 0.00–0.07)
Basophils Absolute: 0.1 10*3/uL (ref 0.0–0.1)
Basophils Relative: 1 %
Eosinophils Absolute: 0.4 10*3/uL (ref 0.0–0.5)
Eosinophils Relative: 5 %
HCT: 35.3 % — ABNORMAL LOW (ref 36.0–46.0)
Hemoglobin: 11.3 g/dL — ABNORMAL LOW (ref 12.0–15.0)
Immature Granulocytes: 0 %
Lymphocytes Relative: 23 %
Lymphs Abs: 1.7 10*3/uL (ref 0.7–4.0)
MCH: 28.5 pg (ref 26.0–34.0)
MCHC: 32 g/dL (ref 30.0–36.0)
MCV: 88.9 fL (ref 80.0–100.0)
Monocytes Absolute: 0.6 10*3/uL (ref 0.1–1.0)
Monocytes Relative: 8 %
Neutro Abs: 4.9 10*3/uL (ref 1.7–7.7)
Neutrophils Relative %: 63 %
Platelets: 291 10*3/uL (ref 150–400)
RBC: 3.97 MIL/uL (ref 3.87–5.11)
RDW: 14 % (ref 11.5–15.5)
WBC: 7.7 10*3/uL (ref 4.0–10.5)
nRBC: 0 % (ref 0.0–0.2)

## 2021-02-01 LAB — PROTIME-INR
INR: 1 (ref 0.8–1.2)
Prothrombin Time: 12.3 seconds (ref 11.4–15.2)

## 2021-02-01 LAB — COMPREHENSIVE METABOLIC PANEL
ALT: 14 U/L (ref 0–44)
AST: 25 U/L (ref 15–41)
Albumin: 4.4 g/dL (ref 3.5–5.0)
Alkaline Phosphatase: 61 U/L (ref 38–126)
Anion gap: 11 (ref 5–15)
BUN: 18 mg/dL (ref 8–23)
CO2: 27 mmol/L (ref 22–32)
Calcium: 10 mg/dL (ref 8.9–10.3)
Chloride: 96 mmol/L — ABNORMAL LOW (ref 98–111)
Creatinine, Ser: 0.61 mg/dL (ref 0.44–1.00)
GFR, Estimated: >60 mL/min (ref >60)
Glucose, Bld: 94 mg/dL (ref 70–99)
Potassium: 3.5 mmol/L (ref 3.5–5.1)
Sodium: 134 mmol/L — ABNORMAL LOW (ref 135–145)
Total Bilirubin: 0.5 mg/dL (ref 0.3–1.2)
Total Protein: 7.6 g/dL (ref 6.5–8.1)

## 2021-02-01 LAB — SEDIMENTATION RATE: Sed Rate: 22 mm/hr (ref 0–30)

## 2021-02-01 LAB — C-REACTIVE PROTEIN: CRP: 0.8 mg/dL (ref <1.0)

## 2021-02-01 LAB — TYPE AND SCREEN
ABO/RH(D): O POS
Antibody Screen: NEGATIVE

## 2021-02-01 LAB — SURGICAL PCR SCREEN
MRSA, PCR: NEGATIVE
Staphylococcus aureus: NEGATIVE

## 2021-02-01 LAB — APTT: aPTT: 32 seconds (ref 24–36)

## 2021-02-01 NOTE — Patient Instructions (Addendum)
Your procedure is scheduled on:02-12-21 MONDAY Report to the Registration Desk on the 1st floor of the [facility]-Then proceed to the 2nd floor Surgery Desk in the [place] To find out your arrival time, please call [phone] between 1PM - 3PM on:02-09-21 FRIDAY  REMEMBER: Instructions that are not followed completely may result in serious medical risk, up to and including death; or upon the discretion of your surgeon and anesthesiologist your surgery may need to be rescheduled.  Do not eat food after midnight the night before surgery.  No gum chewing, lozengers or hard candies.  You may however, drink CLEAR liquids up to 2 hours before you are scheduled to arrive for your surgery. Do not drink anything within 2 hours of your scheduled arrival time.  Clear liquids include: - water  - apple juice without pulp - gatorade - black coffee or tea (Do NOT add milk or creamers to the coffee or tea) Do NOT drink anything that is not on this list.  In addition, your doctor has ordered for you to drink the provided  Ensure Pre-Surgery Clear Carbohydrate Drink  Drinking this carbohydrate drink up to two hours before surgery helps to reduce insulin resistance and improve patient outcomes. Please complete drinking 2 hours prior to scheduled arrival time.  TAKE THESE MEDICATIONS THE MORNING OF SURGERY WITH A SIP OF WATER: -ZOLOFT (SERTRALINE)  Follow recommendations from Cardiologist, Pulmonologist or PCP regarding stopping Aspirin, Coumadin, Plavix, Eliquis, Pradaxa, or Pletal-STOP YOUR 81 MG ASPIRIN 7 DAYS PRIOR TO SURGERY  One week prior to surgery: Stop Anti-inflammatories (NSAIDS) such as MOBIC (MELOXICAM), Advil, Aleve, Ibuprofen, Motrin, Naproxen, Naprosyn and Aspirin based products such as Excedrin, Goodys Powder, BC Powder-OK TO TAKE TYLENOL IF NEEDED  Stop ANY OVER THE COUNTER supplements until after surgery (However, you may continue taking Calcium-Vitamin D and  multivitamin up  until the day before surgery.)  No Alcohol for 24 hours before or after surgery.  No Smoking including e-cigarettes for 24 hours prior to surgery.  No chewable tobacco products for at least 6 hours prior to surgery.  No nicotine patches on the day of surgery.  Do not use any "recreational" drugs for at least a week prior to your surgery.  Please be advised that the combination of cocaine and anesthesia may have negative outcomes, up to and including death. If you test positive for cocaine, your surgery will be cancelled.  On the morning of surgery brush your teeth with toothpaste and water, you may rinse your mouth with mouthwash if you wish. Do not swallow any toothpaste or mouthwash.  Do not wear jewelry, make-up, hairpins, clips or nail polish.  Do not wear lotions, powders, or perfumes.   Do not shave body from the neck down 48 hours prior to surgery just in case you cut yourself which could leave a site for infection.  Also, freshly shaved skin may become irritated if using the CHG soap.  Contact lenses, hearing aids and dentures may not be worn into surgery.  Do not bring valuables to the hospital. [facility] is not responsible for any missing/lost belongings or valuables.   Use CHG Soap as directed on instruction sheet.  Notify your doctor if there is any change in your medical condition (cold, fever, infection).  Wear comfortable clothing (specific to your surgery type) to the hospital.  Plan for stool softeners for home use; pain medications have a tendency to cause constipation. You can also help prevent constipation by eating foods high  in fiber such as fruits and vegetables and drinking plenty of fluids as your diet allows.  After surgery, you can help prevent lung complications by doing breathing exercises.  Take deep breaths and cough every 1-2 hours. Your doctor may order a device called an Incentive Spirometer to help you take deep breaths. When coughing or  sneezing, hold a pillow firmly against your incision with both hands. This is called "splinting." Doing this helps protect your incision. It also decreases belly discomfort.  If you are being admitted to the hospital overnight, leave your suitcase in the car. After surgery it may be brought to your room.  If you are being discharged the day of surgery, you will not be allowed to drive home. You will need a responsible adult (18 years or older) to drive you home and stay with you that night.   If you are taking public transportation, you will need to have a responsible adult (18 years or older) with you. Please confirm with your physician that it is acceptable to use public transportation.   Please call the [place] Dept. at [phone] if you have any questions about these instructions.  Visitation Policy:  Patients undergoing a surgery or procedure may have one family member or support person with them as long as that person is not COVID-19 positive or experiencing its symptoms.  That person may remain in the waiting area during the procedure.  Inpatient Visitation:    Visiting hours are 7 a.m. to 8 p.m. Patients will be allowed one visitor. The visitor may change daily. The visitor must pass COVID-19 screenings, use hand sanitizer when entering and exiting the patient's room and wear a mask at all times, including in the patient's room. Patients must also wear a mask when staff or their visitor are in the room. Masking is required regardless of vaccination status. Systemwide, no visitors 17 or younger.

## 2021-02-01 NOTE — Pre-Procedure Instructions (Signed)
Patient unable to void sufficient amount of urine for UA/C&S, C&S sent per order by Honor Loh NP.

## 2021-02-03 LAB — URINE CULTURE: Special Requests: NORMAL

## 2021-02-06 ENCOUNTER — Other Ambulatory Visit: Payer: Medicare Other

## 2021-02-08 ENCOUNTER — Other Ambulatory Visit
Admission: RE | Admit: 2021-02-08 | Discharge: 2021-02-08 | Disposition: A | Discharge status: Home / Self Care | Payer: Medicare Other | Source: Ambulatory Visit | Attending: Orthopedic Surgery | Admitting: Orthopedic Surgery

## 2021-02-08 ENCOUNTER — Other Ambulatory Visit: Payer: Self-pay

## 2021-02-08 DIAGNOSIS — Z20822 Contact with and (suspected) exposure to covid-19: Secondary | ICD-10-CM | POA: Diagnosis not present

## 2021-02-08 DIAGNOSIS — Z01812 Encounter for preprocedural laboratory examination: Secondary | ICD-10-CM | POA: Diagnosis present

## 2021-02-08 LAB — SARS CORONAVIRUS 2 (TAT 6-24 HRS): SARS Coronavirus 2: NEGATIVE

## 2021-02-11 ENCOUNTER — Encounter: Payer: Self-pay | Admitting: Orthopedic Surgery

## 2021-02-11 DIAGNOSIS — F3342 Major depressive disorder, recurrent, in full remission: Secondary | ICD-10-CM | POA: Insufficient documentation

## 2021-02-11 DIAGNOSIS — C44300 Unspecified malignant neoplasm of skin of unspecified part of face: Secondary | ICD-10-CM | POA: Insufficient documentation

## 2021-02-11 DIAGNOSIS — M1990 Unspecified osteoarthritis, unspecified site: Secondary | ICD-10-CM | POA: Insufficient documentation

## 2021-02-11 DIAGNOSIS — E785 Hyperlipidemia, unspecified: Secondary | ICD-10-CM | POA: Insufficient documentation

## 2021-02-11 DIAGNOSIS — K635 Polyp of colon: Secondary | ICD-10-CM | POA: Insufficient documentation

## 2021-02-11 DIAGNOSIS — N6019 Diffuse cystic mastopathy of unspecified breast: Secondary | ICD-10-CM | POA: Insufficient documentation

## 2021-02-11 NOTE — H&P (Signed)
ORTHOPAEDIC HISTORY & PHYSICAL Regino Bellow, PA - 02/05/2021 10:45 AM EST Formatting of this note is different from the original. Images from the original note were not included. Chief Complaint Chief Complaint  Patient presents with  . Post Operative Visit   Reason for Visit Susan Ayala is a 87 y.o. who presents today for history and physical. She is to undergo a left total hip arthroplasty on 02/12/2021. Last seen in the clinic on 11/24/2020. There have been no change in her condition since that time.  She has a 10-month history of progressive left hip and groin pain. The pain is worse with weight bearing and causes sleep disturbance. The patient has not appreciated any significant improvement despite topical NSAIDs, activity modification, and use of ambulatory aids. She was unable to tolerate physical therapy due to a significant increase in her pain. She had noticed some improvement with use of meloxicam but the medication was discontinued due to an elevation in her blood pressure. The left hip pain limits the patient's ability to ambulate long distances. She is using a cane for ambulation. The patient states that the hip pain has progressed to the point that it is significantly interfering with her activities of daily living.  Past Medical History Past Medical History:  Diagnosis Date  . Colon polyp  Adenomatous  . Depression  . Essential hypertension, benign  . Fibrocystic breast  . H/O mammogram 12/30/2012  . History of bone density study 06/25/2012  Low Bone Mass  . History of syncope 09/2001  Negative evaluation by Dr. Ubaldo Glassing  . Hyperlipidemia  . Osteoarthritis  . Osteopenia  H/O osteoporosis  . Seasonal allergic rhinitis  . Skin cancer of face  . Vitiligo   Past Surgical History Past Surgical History:  Procedure Laterality Date  . APPENDECTOMY 1983  with TAH-BSO  . BREAST EXCISIONAL BIOPSY Bilateral 1983  . CATARACT EXTRACTION Bilateral 2013  . COLONOSCOPY  03/28/2009  PH Polyps in 1997, [place] (Father)  . COLONOSCOPY 06/14/2014  [facility] (Father): No repeat due to age per RTE  . HYSTERECTOMY TOTAL ABDOMINAL W/REMOVAL TUBES &/OR OVARIES Bilateral 1983  TAH-BSO for menorrhagia  . Right knee arthroscopy partial medial and lateral meniscetomies 08/26/2016  Dr Marry Guan  . Right total hip arthroplasty 02/16/2014  . TONSILLECTOMY & ADENOIDECTOMY   Past Family History Family History  Problem Relation Age of Onset  . Arthritis Mother  . Colon cancer Father  . Pancreatic cancer Sister  . No Known Problems Brother  . No Known Problems Sister  . Diabetes type II Daughter  . Hip dysplasia Daughter  . Breast cancer Maternal Grandmother  . Breast cancer Maternal Aunt  . Lung cancer Maternal Aunt   Medications Current Outpatient Medications Ordered in Epic  Medication Sig Dispense Refill  . cholecalciferol (VITAMIN D3) 1,000 unit capsule Take 1,000 Units by mouth once daily  . docusate (COLACE) 50 mg/5 mL liquid Take 100 mg by mouth 2 (two) times daily as needed for Constipation  . doxepin (SINEQUAN) 10 MG capsule Take 1 capsule (10 mg total) by mouth nightly For insomnia 90 capsule 3  . hydroCHLOROthiazide (HYDRODIURIL) 25 MG tablet Take 1 tablet (25 mg total) by mouth every morning For blood pressure 90 tablet 3  . lovastatin (MEVACOR) 40 MG tablet Take 1 tablet (40 mg total) by mouth daily with dinner for cholesterol 90 tablet 3  . meloxicam (MOBIC) 7.5 MG tablet Take 1 tablet (7.5 mg total) by mouth once daily Taking 1 QOD alternating  with 2 QOD (Patient taking differently: Take 7.5 mg by mouth once daily as needed for Pain ) 30 tablet 2  . metoprolol succinate (TOPROL-XL) 50 MG XL tablet Take 1 tablet (50 mg total) by mouth every evening for blood pressure 90 tablet 0  . multivitamin capsule Take 1 capsule by mouth once daily  . potassium chloride (KLOR-CON M10) 10 mEq ER tablet Take 2 tablets (20 mEq total) by mouth once daily For potassium 180 tablet 3   . sertraline (ZOLOFT) 50 MG tablet Take 1.5 tablets (75 mg total) by mouth once daily 135 tablet 3  . acetaminophen (TYLENOL) 650 MG ER tablet Take 1,300 mg by mouth 2 (two) times daily  . aspirin 81 MG EC tablet Take 81 mg by mouth once daily. (Patient not taking: Reported on 02/05/2021 )  . calcium carbonate-vitamin D3 600 mg(1,500mg ) -800 unit Tab Take 1 tablet by mouth once daily  . diclofenac (VOLTAREN) 1 % topical gel Apply 2 g topically 4 (four) times daily as needed (Patient not taking: Reported on 02/05/2021 )  . miscellaneous medical supply Misc Use 1 Units once for 1 dose. STAIR LIFT 1 each 0   No current Epic-ordered facility-administered medications on file.   Allergies Allergies  Allergen Reactions  . Codeine Nausea and Vomiting  . Adhesive Tape-Silicones Dermatitis  . Ambien [Zolpidem] Headache    Review of Systems A comprehensive 14 point ROS was performed, reviewed, and the pertinent orthopaedic findings are documented in the HPI.  Exam BP 128/80  Ht 165.1 cm (5\' 5" )  Wt 74 kg (163 lb 3.2 oz)  BMI 27.16 kg/m   General: Well-developed well-nourished female seen in no acute distress.   HEENT: Atraumatic,normocephalic. Pupils are equal and reactive to light. Oropharynx is clear with moist mucosa  Lungs: Clear to auscultation bilaterally   Cardiovascular: Regular rate and rhythm. Normal S1, S2. No murmurs. No appreciable gallops or rubs. Peripheral pulses are palpable.  Abdomen: Soft, non-tender, nondistended. Bowel sounds present  Extremity: Left Hip: Pelvic tilt: Negative Limb lengths: Equal with the patient standing Soft tissue swelling: Negative Erythema: Negative Crepitance: Positive Tenderness: Greater trochanter is nontender to palpation. Moderate pain is elicited by axial compression or extremes of rotation. Atrophy: No atrophy. Fair to good hip flexor and abductor strength. Range of Motion: EXT/FLEX: 0/0/100 ADD/ABD: 20/0/20 IR/ER:  10/0/30  Neurological:  The patient is alert and oriented Sensation to light touch appears to be intact and within normal limits Gross motor strength appeared to be equal to 5/5  Vascular :  Peripheral pulses felt to be palpable. Capillary refill appears to be intact and within normal limits  X-ray  1. AP pelvis, AP and lateral radiographs of the left hip that were obtained in the office on 11/24/2020 showed significant narrowing of the cartilage space with bone-on-bone articulation. Subchondral sclerosis and subchondral cyst formation are noted.  Impression  1. Degenerative arthrosis left hip  Plan   1. Patient has already discontinued her aspirin and meloxicam 2. Patient is planning on staying overnight but going home afterwards. 3. Follow-up 6 weeks as scheduled. Sooner if any problems  This note was generated in part with voice recognition software and I apologize for any typographical errors that were not detected and corrected   Watt Climes PA  Electronically signed by Regino Bellow, PA at 02/05/2021 11:24 AM EST

## 2021-02-12 ENCOUNTER — Inpatient Hospital Stay: Payer: Medicare Other | Admitting: Registered Nurse

## 2021-02-12 ENCOUNTER — Inpatient Hospital Stay: Payer: Medicare Other

## 2021-02-12 ENCOUNTER — Encounter
Admission: RE | Disposition: A | Discharge status: Home Health | Payer: Self-pay | Source: Home / Self Care | Attending: Orthopedic Surgery

## 2021-02-12 ENCOUNTER — Other Ambulatory Visit: Payer: Self-pay

## 2021-02-12 ENCOUNTER — Inpatient Hospital Stay
Admission: RE | Admit: 2021-02-12 | Discharge: 2021-02-13 | DRG: 470 | Disposition: A | Discharge status: Home Health | Payer: Medicare Other | Attending: Orthopedic Surgery | Admitting: Orthopedic Surgery

## 2021-02-12 ENCOUNTER — Encounter: Payer: Self-pay | Admitting: Orthopedic Surgery

## 2021-02-12 DIAGNOSIS — Z86010 Personal history of colonic polyps: Secondary | ICD-10-CM

## 2021-02-12 DIAGNOSIS — Z79899 Other long term (current) drug therapy: Secondary | ICD-10-CM | POA: Diagnosis not present

## 2021-02-12 DIAGNOSIS — J302 Other seasonal allergic rhinitis: Secondary | ICD-10-CM | POA: Diagnosis present

## 2021-02-12 DIAGNOSIS — L80 Vitiligo: Secondary | ICD-10-CM | POA: Diagnosis not present

## 2021-02-12 DIAGNOSIS — Z888 Allergy status to other drugs, medicaments and biological substances status: Secondary | ICD-10-CM

## 2021-02-12 DIAGNOSIS — K219 Gastro-esophageal reflux disease without esophagitis: Secondary | ICD-10-CM | POA: Diagnosis not present

## 2021-02-12 DIAGNOSIS — Z9841 Cataract extraction status, right eye: Secondary | ICD-10-CM

## 2021-02-12 DIAGNOSIS — Z885 Allergy status to narcotic agent status: Secondary | ICD-10-CM

## 2021-02-12 DIAGNOSIS — N6019 Diffuse cystic mastopathy of unspecified breast: Secondary | ICD-10-CM | POA: Diagnosis not present

## 2021-02-12 DIAGNOSIS — F32A Depression, unspecified: Secondary | ICD-10-CM | POA: Diagnosis not present

## 2021-02-12 DIAGNOSIS — Z96642 Presence of left artificial hip joint: Secondary | ICD-10-CM

## 2021-02-12 DIAGNOSIS — E785 Hyperlipidemia, unspecified: Secondary | ICD-10-CM | POA: Diagnosis present

## 2021-02-12 DIAGNOSIS — M1612 Unilateral primary osteoarthritis, left hip: Secondary | ICD-10-CM | POA: Diagnosis not present

## 2021-02-12 DIAGNOSIS — Z791 Long term (current) use of non-steroidal anti-inflammatories (NSAID): Secondary | ICD-10-CM

## 2021-02-12 DIAGNOSIS — E7800 Pure hypercholesterolemia, unspecified: Secondary | ICD-10-CM | POA: Diagnosis not present

## 2021-02-12 DIAGNOSIS — Z90710 Acquired absence of both cervix and uterus: Secondary | ICD-10-CM | POA: Diagnosis not present

## 2021-02-12 DIAGNOSIS — Z8261 Family history of arthritis: Secondary | ICD-10-CM

## 2021-02-12 DIAGNOSIS — Z96641 Presence of right artificial hip joint: Secondary | ICD-10-CM | POA: Diagnosis not present

## 2021-02-12 DIAGNOSIS — Z9109 Other allergy status, other than to drugs and biological substances: Secondary | ICD-10-CM

## 2021-02-12 DIAGNOSIS — Z85828 Personal history of other malignant neoplasm of skin: Secondary | ICD-10-CM

## 2021-02-12 DIAGNOSIS — Z96649 Presence of unspecified artificial hip joint: Secondary | ICD-10-CM

## 2021-02-12 DIAGNOSIS — Z9842 Cataract extraction status, left eye: Secondary | ICD-10-CM

## 2021-02-12 DIAGNOSIS — I10 Essential (primary) hypertension: Secondary | ICD-10-CM | POA: Diagnosis not present

## 2021-02-12 DIAGNOSIS — M810 Age-related osteoporosis without current pathological fracture: Secondary | ICD-10-CM | POA: Diagnosis not present

## 2021-02-12 DIAGNOSIS — M25552 Pain in left hip: Secondary | ICD-10-CM | POA: Diagnosis present

## 2021-02-12 HISTORY — PX: TOTAL HIP ARTHROPLASTY: SHX124

## 2021-02-12 LAB — ABO/RH: ABO/RH(D): O POS

## 2021-02-12 SURGERY — ARTHROPLASTY, HIP, TOTAL,POSTERIOR APPROACH
Anesthesia: Spinal | Site: Hip | Laterality: Left

## 2021-02-12 MED ORDER — PRAVASTATIN SODIUM 40 MG PO TABS
40.0000 mg | ORAL_TABLET | Freq: Every day | ORAL | Status: DC
  Administered 2021-02-12 – 2021-02-13 (×2): 40 mg via ORAL
  Filled 2021-02-12 (×2): qty 1
  Filled 2021-02-12 (×2): qty 2

## 2021-02-12 MED ORDER — HYDROMORPHONE HCL 1 MG/ML IJ SOLN: 0.5000 mg | INTRAMUSCULAR | Status: DC | PRN

## 2021-02-12 MED ORDER — FERROUS SULFATE 325 (65 FE) MG PO TABS
325.0000 mg | ORAL_TABLET | Freq: Two times a day (BID) | ORAL | Status: DC
  Administered 2021-02-12 – 2021-02-13 (×3): 325 mg via ORAL
  Filled 2021-02-12 (×3): qty 1

## 2021-02-12 MED ORDER — ORAL CARE MOUTH RINSE: 15.0000 mL | Freq: Once | OROMUCOSAL | Status: AC

## 2021-02-12 MED ORDER — PROPOFOL 10 MG/ML IV BOLUS
INTRAVENOUS | Status: DC | PRN
Start: 2021-02-12 — End: 2021-02-12
  Administered 2021-02-12: 30 mg via INTRAVENOUS
  Administered 2021-02-12: 20 mg via INTRAVENOUS
  Administered 2021-02-12: 10 mg via INTRAVENOUS

## 2021-02-12 MED ORDER — OXYCODONE HCL 5 MG PO TABS
5.0000 mg | ORAL_TABLET | ORAL | Status: DC | PRN
  Administered 2021-02-12: 10 mg via ORAL
  Filled 2021-02-12: qty 2

## 2021-02-12 MED ORDER — CEFAZOLIN SODIUM-DEXTROSE 2-4 GM/100ML-% IV SOLN
2.0000 g | Freq: Four times a day (QID) | INTRAVENOUS | Status: AC
  Administered 2021-02-12: 2 g via INTRAVENOUS

## 2021-02-12 MED ORDER — TRANEXAMIC ACID-NACL 1000-0.7 MG/100ML-% IV SOLN: 1000.0000 mg | Freq: Once | INTRAVENOUS | Status: AC

## 2021-02-12 MED ORDER — FAMOTIDINE 20 MG PO TABS
ORAL_TABLET | ORAL | Status: AC
  Administered 2021-02-12: 20 mg via ORAL
  Filled 2021-02-12: qty 1

## 2021-02-12 MED ORDER — VITAMIN D 25 MCG (1000 UNIT) PO TABS
1000.0000 [IU] | ORAL_TABLET | Freq: Every day | ORAL | Status: DC
  Administered 2021-02-13: 1000 [IU] via ORAL
  Filled 2021-02-12: qty 1

## 2021-02-12 MED ORDER — ONDANSETRON HCL 4 MG/2ML IJ SOLN
4.0000 mg | Freq: Four times a day (QID) | INTRAMUSCULAR | Status: DC | PRN
  Administered 2021-02-12: 4 mg via INTRAVENOUS
  Filled 2021-02-12: qty 2

## 2021-02-12 MED ORDER — TRANEXAMIC ACID-NACL 1000-0.7 MG/100ML-% IV SOLN
INTRAVENOUS | Status: AC
  Administered 2021-02-12: 1000 mg via INTRAVENOUS
  Filled 2021-02-12: qty 100

## 2021-02-12 MED ORDER — DEXMEDETOMIDINE (PRECEDEX) IN NS 20 MCG/5ML (4 MCG/ML) IV SYRINGE
PREFILLED_SYRINGE | INTRAVENOUS | Status: AC
  Filled 2021-02-12: qty 5

## 2021-02-12 MED ORDER — SENNOSIDES-DOCUSATE SODIUM 8.6-50 MG PO TABS
1.0000 | ORAL_TABLET | Freq: Two times a day (BID) | ORAL | Status: DC
  Administered 2021-02-12 – 2021-02-13 (×2): 1 via ORAL
  Filled 2021-02-12 (×2): qty 1

## 2021-02-12 MED ORDER — FENTANYL CITRATE (PF) 100 MCG/2ML IJ SOLN
INTRAMUSCULAR | Status: AC
  Administered 2021-02-12: 25 ug via INTRAVENOUS
  Filled 2021-02-12: qty 2

## 2021-02-12 MED ORDER — EPHEDRINE 5 MG/ML INJ
INTRAVENOUS | Status: AC
  Filled 2021-02-12: qty 10

## 2021-02-12 MED ORDER — PANTOPRAZOLE SODIUM 40 MG PO TBEC
40.0000 mg | DELAYED_RELEASE_TABLET | Freq: Two times a day (BID) | ORAL | Status: DC
  Administered 2021-02-12 – 2021-02-13 (×2): 40 mg via ORAL
  Filled 2021-02-12 (×2): qty 1

## 2021-02-12 MED ORDER — SODIUM CHLORIDE 0.9 % IV SOLN
INTRAVENOUS | Status: DC | PRN
  Administered 2021-02-12: 25 ug/min via INTRAVENOUS

## 2021-02-12 MED ORDER — MENTHOL 3 MG MT LOZG
1.0000 | LOZENGE | OROMUCOSAL | Status: DC | PRN
  Filled 2021-02-12: qty 9

## 2021-02-12 MED ORDER — ONDANSETRON HCL 4 MG/2ML IJ SOLN
INTRAMUSCULAR | Status: DC | PRN
  Administered 2021-02-12: 4 mg via INTRAVENOUS

## 2021-02-12 MED ORDER — ACETAMINOPHEN 325 MG PO TABS: 325.0000 mg | ORAL_TABLET | Freq: Four times a day (QID) | ORAL | Status: DC | PRN

## 2021-02-12 MED ORDER — TRANEXAMIC ACID-NACL 1000-0.7 MG/100ML-% IV SOLN
1000.0000 mg | INTRAVENOUS | Status: AC
  Administered 2021-02-12: 1000 mg via INTRAVENOUS

## 2021-02-12 MED ORDER — CELECOXIB 200 MG PO CAPS
ORAL_CAPSULE | ORAL | Status: AC
  Administered 2021-02-12: 400 mg via ORAL
  Filled 2021-02-12: qty 2

## 2021-02-12 MED ORDER — METOCLOPRAMIDE HCL 10 MG PO TABS
10.0000 mg | ORAL_TABLET | Freq: Three times a day (TID) | ORAL | Status: DC
  Administered 2021-02-12 – 2021-02-13 (×5): 10 mg via ORAL
  Filled 2021-02-12 (×5): qty 1

## 2021-02-12 MED ORDER — PROPOFOL 500 MG/50ML IV EMUL
INTRAVENOUS | Status: AC
  Filled 2021-02-12: qty 50

## 2021-02-12 MED ORDER — METOPROLOL SUCCINATE ER 25 MG PO TB24
50.0000 mg | ORAL_TABLET | Freq: Every evening | ORAL | Status: DC
  Administered 2021-02-12: 50 mg via ORAL
  Filled 2021-02-12 (×2): qty 2

## 2021-02-12 MED ORDER — FLEET ENEMA 7-19 GM/118ML RE ENEM: 1.0000 | ENEMA | Freq: Once | RECTAL | Status: DC | PRN

## 2021-02-12 MED ORDER — KETAMINE HCL 10 MG/ML IJ SOLN
INTRAMUSCULAR | Status: DC | PRN
  Administered 2021-02-12: 10 mg via INTRAVENOUS
  Administered 2021-02-12 (×2): 20 mg via INTRAVENOUS
  Administered 2021-02-12: 30 mg via INTRAVENOUS

## 2021-02-12 MED ORDER — ENOXAPARIN SODIUM 40 MG/0.4ML ~~LOC~~ SOLN
40.0000 mg | SUBCUTANEOUS | Status: DC
  Administered 2021-02-13: 40 mg via SUBCUTANEOUS
  Filled 2021-02-12: qty 0.4

## 2021-02-12 MED ORDER — ONDANSETRON HCL 4 MG PO TABS: 4.0000 mg | ORAL_TABLET | Freq: Four times a day (QID) | ORAL | Status: DC | PRN

## 2021-02-12 MED ORDER — ONDANSETRON HCL 4 MG/2ML IJ SOLN: 4.0000 mg | Freq: Once | INTRAMUSCULAR | Status: DC | PRN

## 2021-02-12 MED ORDER — SERTRALINE HCL 50 MG PO TABS
75.0000 mg | ORAL_TABLET | ORAL | Status: DC
  Administered 2021-02-13: 75 mg via ORAL
  Filled 2021-02-12 (×2): qty 1

## 2021-02-12 MED ORDER — PHENOL 1.4 % MT LIQD
1.0000 | OROMUCOSAL | Status: DC | PRN
  Filled 2021-02-12: qty 177

## 2021-02-12 MED ORDER — CEFAZOLIN SODIUM-DEXTROSE 2-4 GM/100ML-% IV SOLN: 2.0000 g | Freq: Four times a day (QID) | INTRAVENOUS | Status: DC

## 2021-02-12 MED ORDER — FENTANYL CITRATE (PF) 100 MCG/2ML IJ SOLN
25.0000 ug | INTRAMUSCULAR | Status: DC | PRN
  Administered 2021-02-12 (×3): 25 ug via INTRAVENOUS

## 2021-02-12 MED ORDER — ACETAMINOPHEN 10 MG/ML IV SOLN
INTRAVENOUS | Status: DC | PRN
  Administered 2021-02-12: 1000 mg via INTRAVENOUS

## 2021-02-12 MED ORDER — PROPOFOL 500 MG/50ML IV EMUL
INTRAVENOUS | Status: DC | PRN
  Administered 2021-02-12: 75 ug/kg/min via INTRAVENOUS

## 2021-02-12 MED ORDER — DEXAMETHASONE SODIUM PHOSPHATE 10 MG/ML IJ SOLN
INTRAMUSCULAR | Status: AC
  Administered 2021-02-12: 8 mg via INTRAVENOUS
  Filled 2021-02-12: qty 1

## 2021-02-12 MED ORDER — CHLORHEXIDINE GLUCONATE 0.12 % MT SOLN: 15.0000 mL | Freq: Once | OROMUCOSAL | Status: AC

## 2021-02-12 MED ORDER — CHLORHEXIDINE GLUCONATE 0.12 % MT SOLN
OROMUCOSAL | Status: AC
  Administered 2021-02-12: 15 mL via OROMUCOSAL
  Filled 2021-02-12: qty 15

## 2021-02-12 MED ORDER — MECLIZINE HCL 25 MG PO TABS
25.0000 mg | ORAL_TABLET | Freq: Three times a day (TID) | ORAL | Status: DC | PRN
  Filled 2021-02-12: qty 1

## 2021-02-12 MED ORDER — ALUM & MAG HYDROXIDE-SIMETH 200-200-20 MG/5ML PO SUSP: 30.0000 mL | ORAL | Status: DC | PRN

## 2021-02-12 MED ORDER — ADULT MULTIVITAMIN W/MINERALS CH
1.0000 | ORAL_TABLET | Freq: Every day | ORAL | Status: DC
  Administered 2021-02-13: 1 via ORAL
  Filled 2021-02-12: qty 1

## 2021-02-12 MED ORDER — BUPIVACAINE HCL (PF) 0.5 % IJ SOLN
INTRAMUSCULAR | Status: DC | PRN
  Administered 2021-02-12: 2.9 mL

## 2021-02-12 MED ORDER — NEOMYCIN-POLYMYXIN B GU 40-200000 IR SOLN
Status: DC | PRN
  Administered 2021-02-12: 16 mL

## 2021-02-12 MED ORDER — ACETAMINOPHEN 10 MG/ML IV SOLN
1000.0000 mg | Freq: Four times a day (QID) | INTRAVENOUS | Status: AC
  Administered 2021-02-13 (×3): 1000 mg via INTRAVENOUS
  Filled 2021-02-12 (×3): qty 100

## 2021-02-12 MED ORDER — DEXAMETHASONE SODIUM PHOSPHATE 10 MG/ML IJ SOLN: 8.0000 mg | Freq: Once | INTRAMUSCULAR | Status: AC

## 2021-02-12 MED ORDER — DIPHENHYDRAMINE HCL 12.5 MG/5ML PO ELIX: 12.5000 mg | ORAL_SOLUTION | ORAL | Status: DC | PRN

## 2021-02-12 MED ORDER — LACTATED RINGERS IV SOLN: INTRAVENOUS | Status: DC

## 2021-02-12 MED ORDER — KETAMINE HCL 50 MG/5ML IJ SOSY
PREFILLED_SYRINGE | INTRAMUSCULAR | Status: AC
  Filled 2021-02-12: qty 5

## 2021-02-12 MED ORDER — ACETAMINOPHEN 10 MG/ML IV SOLN
INTRAVENOUS | Status: AC
  Administered 2021-02-12: 1000 mg via INTRAVENOUS
  Filled 2021-02-12: qty 100

## 2021-02-12 MED ORDER — SURGIRINSE WOUND IRRIGATION SYSTEM - OPTIME
TOPICAL | Status: DC | PRN
  Administered 2021-02-12: 450 mL via TOPICAL

## 2021-02-12 MED ORDER — GABAPENTIN 300 MG PO CAPS
ORAL_CAPSULE | ORAL | Status: AC
  Administered 2021-02-12: 300 mg via ORAL
  Filled 2021-02-12: qty 1

## 2021-02-12 MED ORDER — TRANEXAMIC ACID-NACL 1000-0.7 MG/100ML-% IV SOLN
INTRAVENOUS | Status: AC
  Filled 2021-02-12: qty 100

## 2021-02-12 MED ORDER — CALCIUM CARBONATE ANTACID 500 MG PO CHEW: 1.0000 | CHEWABLE_TABLET | ORAL | Status: DC | PRN

## 2021-02-12 MED ORDER — CHLORHEXIDINE GLUCONATE 4 % EX LIQD: 60.0000 mL | Freq: Once | CUTANEOUS | Status: DC

## 2021-02-12 MED ORDER — PHENYLEPHRINE HCL (PRESSORS) 10 MG/ML IV SOLN
INTRAVENOUS | Status: DC | PRN
  Administered 2021-02-12: 100 ug via INTRAVENOUS
  Administered 2021-02-12: 200 ug via INTRAVENOUS
  Administered 2021-02-12 (×2): 150 ug via INTRAVENOUS
  Administered 2021-02-12: 100 ug via INTRAVENOUS

## 2021-02-12 MED ORDER — OXYCODONE HCL 5 MG PO TABS
ORAL_TABLET | ORAL | Status: AC
  Administered 2021-02-12: 5 mg via ORAL
  Filled 2021-02-12: qty 1

## 2021-02-12 MED ORDER — POTASSIUM CHLORIDE CRYS ER 10 MEQ PO TBCR
40.0000 meq | EXTENDED_RELEASE_TABLET | Freq: Every day | ORAL | Status: DC
  Administered 2021-02-13: 40 meq via ORAL
  Filled 2021-02-12: qty 4

## 2021-02-12 MED ORDER — CALCIUM CARBONATE-VITAMIN D 500-200 MG-UNIT PO TABS
1.0000 | ORAL_TABLET | Freq: Every day | ORAL | Status: DC
  Administered 2021-02-13: 1 via ORAL
  Filled 2021-02-12 (×3): qty 1

## 2021-02-12 MED ORDER — FAMOTIDINE 20 MG PO TABS: 20.0000 mg | ORAL_TABLET | Freq: Once | ORAL | Status: AC

## 2021-02-12 MED ORDER — CEFAZOLIN SODIUM-DEXTROSE 2-4 GM/100ML-% IV SOLN
2.0000 g | INTRAVENOUS | Status: AC
  Administered 2021-02-12: 2 g via INTRAVENOUS

## 2021-02-12 MED ORDER — ACETAMINOPHEN 10 MG/ML IV SOLN
INTRAVENOUS | Status: AC
  Filled 2021-02-12: qty 100

## 2021-02-12 MED ORDER — CEFAZOLIN SODIUM-DEXTROSE 2-4 GM/100ML-% IV SOLN
INTRAVENOUS | Status: AC
  Filled 2021-02-12: qty 100

## 2021-02-12 MED ORDER — HYDROCHLOROTHIAZIDE 25 MG PO TABS
25.0000 mg | ORAL_TABLET | ORAL | Status: DC
  Administered 2021-02-13: 25 mg via ORAL
  Filled 2021-02-12: qty 1

## 2021-02-12 MED ORDER — TRAMADOL HCL 50 MG PO TABS
50.0000 mg | ORAL_TABLET | ORAL | Status: DC | PRN
  Administered 2021-02-12: 50 mg via ORAL
  Filled 2021-02-12: qty 1

## 2021-02-12 MED ORDER — GABAPENTIN 300 MG PO CAPS: 300.0000 mg | ORAL_CAPSULE | Freq: Once | ORAL | Status: AC

## 2021-02-12 MED ORDER — PHENYLEPHRINE HCL (PRESSORS) 10 MG/ML IV SOLN
INTRAVENOUS | Status: AC
  Filled 2021-02-12: qty 1

## 2021-02-12 MED ORDER — CELECOXIB 200 MG PO CAPS: 400.0000 mg | ORAL_CAPSULE | Freq: Once | ORAL | Status: AC

## 2021-02-12 MED ORDER — SODIUM CHLORIDE 0.9 % IV SOLN: INTRAVENOUS | Status: DC

## 2021-02-12 MED ORDER — BISACODYL 10 MG RE SUPP: 10.0000 mg | Freq: Every day | RECTAL | Status: DC | PRN

## 2021-02-12 MED ORDER — DEXMEDETOMIDINE HCL 200 MCG/2ML IV SOLN
INTRAVENOUS | Status: DC | PRN
  Administered 2021-02-12: 12 ug via INTRAVENOUS
  Administered 2021-02-12: 8 ug via INTRAVENOUS

## 2021-02-12 MED ORDER — DOXEPIN HCL 10 MG PO CAPS
10.0000 mg | ORAL_CAPSULE | Freq: Every day | ORAL | Status: DC
  Administered 2021-02-12: 10 mg via ORAL
  Filled 2021-02-12 (×2): qty 1

## 2021-02-12 MED ORDER — CELECOXIB 200 MG PO CAPS
200.0000 mg | ORAL_CAPSULE | Freq: Two times a day (BID) | ORAL | Status: DC
  Administered 2021-02-12 – 2021-02-13 (×2): 200 mg via ORAL
  Filled 2021-02-12 (×2): qty 1

## 2021-02-12 MED ORDER — ACETAMINOPHEN 10 MG/ML IV SOLN: 1000.0000 mg | Freq: Four times a day (QID) | INTRAVENOUS | Status: DC

## 2021-02-12 MED ORDER — EPHEDRINE SULFATE 50 MG/ML IJ SOLN
INTRAMUSCULAR | Status: DC | PRN
  Administered 2021-02-12 (×2): 15 mg via INTRAVENOUS
  Administered 2021-02-12 (×2): 5 mg via INTRAVENOUS

## 2021-02-12 MED ORDER — MAGNESIUM HYDROXIDE 400 MG/5ML PO SUSP
30.0000 mL | Freq: Every day | ORAL | Status: DC
  Administered 2021-02-13: 30 mL via ORAL
  Filled 2021-02-12: qty 30

## 2021-02-12 MED ORDER — CELECOXIB 200 MG PO CAPS: 200.0000 mg | ORAL_CAPSULE | Freq: Two times a day (BID) | ORAL | Status: DC

## 2021-02-12 MED ORDER — BUPIVACAINE HCL (PF) 0.5 % IJ SOLN
INTRAMUSCULAR | Status: AC
  Filled 2021-02-12: qty 10

## 2021-02-12 SURGICAL SUPPLY — 62 items
BLADE DRUM FLTD (BLADE) ×2 IMPLANT
BLADE SAW 90X25X1.19 OSCILLAT (BLADE) ×2 IMPLANT
CANISTER SUCT 1200ML W/VALVE (MISCELLANEOUS) ×2 IMPLANT
CANISTER SUCT 3000ML PPV (MISCELLANEOUS) ×4 IMPLANT
CARTRIDGE OIL MAESTRO DRILL (MISCELLANEOUS) ×1 IMPLANT
COVER WAND RF STERILE (DRAPES) ×2 IMPLANT
CUP ACETBLR 52 OD 100 SERIES (Hips) ×1 IMPLANT
DIFFUSER DRILL AIR PNEUMATIC (MISCELLANEOUS) ×2 IMPLANT
DRAPE 3/4 80X56 (DRAPES) ×2 IMPLANT
DRAPE INCISE IOBAN 66X60 STRL (DRAPES) ×2 IMPLANT
DRSG DERMACEA 8X12 NADH (GAUZE/BANDAGES/DRESSINGS) ×2 IMPLANT
DRSG MEPILEX SACRM 8.7X9.8 (GAUZE/BANDAGES/DRESSINGS) ×2 IMPLANT
DRSG OPSITE POSTOP 4X14 (GAUZE/BANDAGES/DRESSINGS) ×1 IMPLANT
DRSG TEGADERM 4X4.75 (GAUZE/BANDAGES/DRESSINGS) ×2 IMPLANT
DURAPREP 26ML APPLICATOR (WOUND CARE) ×2 IMPLANT
ELECT REM PT RETURN 9FT ADLT (ELECTROSURGICAL) ×2
ELECTRODE REM PT RTRN 9FT ADLT (ELECTROSURGICAL) ×1 IMPLANT
GLOVE BIOGEL PI IND STRL 7.5 (GLOVE) ×1 IMPLANT
GLOVE BIOGEL PI INDICATOR 7.5 (GLOVE) ×1
GLOVE INDICATOR 8.0 STRL GRN (GLOVE) ×2 IMPLANT
GLOVE SURG ENC MOIS LTX SZ7.5 (GLOVE) ×4 IMPLANT
GLOVE SURG ENC TEXT LTX SZ7.5 (GLOVE) ×4 IMPLANT
GOWN STRL REUS W/ TWL LRG LVL3 (GOWN DISPOSABLE) ×2 IMPLANT
GOWN STRL REUS W/ TWL XL LVL3 (GOWN DISPOSABLE) ×1 IMPLANT
GOWN STRL REUS W/TWL LRG LVL3 (GOWN DISPOSABLE) ×2
GOWN STRL REUS W/TWL XL LVL3 (GOWN DISPOSABLE) ×1
HEAD M SROM 36MM PLUS 1.5 (Hips) IMPLANT
HEMOVAC 400CC 10FR (MISCELLANEOUS) ×2 IMPLANT
HOLDER FOLEY CATH W/STRAP (MISCELLANEOUS) ×2 IMPLANT
HOOD PEEL AWAY FLYTE STAYCOOL (MISCELLANEOUS) ×4 IMPLANT
IRRIGATION SURGIPHOR STRL (IV SOLUTION) ×2 IMPLANT
KIT PEG BOARD PINK (KITS) ×2 IMPLANT
KIT TURNOVER KIT A (KITS) ×2 IMPLANT
LINER ACETAB NEUTRAL 36ID 520D (Liner) ×1 IMPLANT
MANIFOLD NEPTUNE II (INSTRUMENTS) ×4 IMPLANT
NDL SAFETY ECLIPSE 18X1.5 (NEEDLE) ×1 IMPLANT
NEEDLE HYPO 18GX1.5 SHARP (NEEDLE) ×1
NS IRRIG 500ML POUR BTL (IV SOLUTION) ×2 IMPLANT
OIL CARTRIDGE MAESTRO DRILL (MISCELLANEOUS) ×2
PACK HIP PROSTHESIS (MISCELLANEOUS) ×2 IMPLANT
PENCIL SMOKE EVACUATOR (MISCELLANEOUS) ×2 IMPLANT
PIN STEIN THRED 5/32 (Pin) ×2 IMPLANT
PULSAVAC PLUS IRRIG FAN TIP (DISPOSABLE) ×2
SOL .9 NS 3000ML IRR  AL (IV SOLUTION) ×1
SOL .9 NS 3000ML IRR UROMATIC (IV SOLUTION) ×1 IMPLANT
SOL PREP PVP 2OZ (MISCELLANEOUS) ×2
SOLUTION PREP PVP 2OZ (MISCELLANEOUS) ×1 IMPLANT
SPONGE DRAIN TRACH 4X4 STRL 2S (GAUZE/BANDAGES/DRESSINGS) ×2 IMPLANT
SROM M HEAD 36MM PLUS 1.5 (Hips) ×2 IMPLANT
STAPLER SKIN PROX 35W (STAPLE) ×2 IMPLANT
STEM FEM CMNTLSS SM AML 13.5 (Hips) ×1 IMPLANT
SUT ETHIBOND #5 BRAIDED 30INL (SUTURE) ×2 IMPLANT
SUT VIC AB 0 CT1 36 (SUTURE) ×2 IMPLANT
SUT VIC AB 1 CT1 36 (SUTURE) ×4 IMPLANT
SUT VIC AB 2-0 CT1 27 (SUTURE) ×1
SUT VIC AB 2-0 CT1 TAPERPNT 27 (SUTURE) ×1 IMPLANT
SYR 20ML LL LF (SYRINGE) ×2 IMPLANT
TAPE CLOTH 3X10 WHT NS LF (GAUZE/BANDAGES/DRESSINGS) ×2 IMPLANT
TAPE TRANSPORE STRL 2 31045 (GAUZE/BANDAGES/DRESSINGS) ×2 IMPLANT
TIP FAN IRRIG PULSAVAC PLUS (DISPOSABLE) ×1 IMPLANT
TOWEL OR 17X26 4PK STRL BLUE (TOWEL DISPOSABLE) ×2 IMPLANT
TRAY FOLEY MTR SLVR 16FR STAT (SET/KITS/TRAYS/PACK) ×2 IMPLANT

## 2021-02-12 NOTE — Transfer of Care (Signed)
Immediate Anesthesia Transfer of Care Note  Patient: AMIERA HERZBERG  Procedure(s) Performed: TOTAL HIP ARTHROPLASTY (Left Hip)  Patient Location: PACU  Anesthesia Type:General and Spinal  Level of Consciousness: drowsy  Airway & Oxygen Therapy: Patient Spontanous Breathing and Patient connected to face mask oxygen  Post-op Assessment: Report given to RN and Post -op Vital signs reviewed and stable  Post vital signs: Reviewed and stable  Last Vitals:  Vitals Value Taken Time  BP 130/70 02/12/21 1045  Temp 36.4 C 02/12/21 1045  Pulse 92 02/12/21 1047  Resp 17 02/12/21 1047  SpO2 98 % 02/12/21 1047  Vitals shown include unvalidated device data.  Last Pain:  Vitals:   02/12/21 0635  TempSrc: Temporal  PainSc: 0-No pain         Complications: No complications documented.

## 2021-02-12 NOTE — Anesthesia Preprocedure Evaluation (Signed)
Anesthesia Evaluation  Patient identified by MRN, date of birth, ID band Patient awake    Reviewed: Allergy & Precautions, NPO status , Patient's Chart, lab work & pertinent test results  History of Anesthesia Complications (+) PONV and history of anesthetic complications  Airway Mallampati: III  TM Distance: >3 FB Neck ROM: Full  Mouth opening: Limited Mouth Opening  Dental  (+) Dental Advidsory Given, Caps, Teeth Intact Permanent bridge on top right:   Pulmonary neg pulmonary ROS, neg shortness of breath, neg sleep apnea, neg COPD, neg recent URI,    Pulmonary exam normal breath sounds clear to auscultation- rhonchi (-) wheezing      Cardiovascular Exercise Tolerance: Good hypertension, Pt. on medications (-) angina(-) CAD, (-) Past MI and (-) Cardiac Stents (-) dysrhythmias (-) Valvular Problems/Murmurs Rhythm:Regular Rate:Normal - Systolic murmurs and - Diastolic murmurs    Neuro/Psych neg Seizures PSYCHIATRIC DISORDERS Anxiety Depression negative neurological ROS     GI/Hepatic Neg liver ROS, GERD  ,  Endo/Other  negative endocrine ROSneg diabetes  Renal/GU negative Renal ROS     Musculoskeletal  (+) Arthritis , Osteoarthritis,    Abdominal (+) - obese,   Peds  Hematology negative hematology ROS (+)   Anesthesia Other Findings Past Medical History: No date: Arthritis No date: Depression No date: Hypercholesterolemia No date: Hypertension No date: Osteoporosis No date: PONV (postoperative nausea and vomiting)   Reproductive/Obstetrics                             Anesthesia Physical  Anesthesia Plan  ASA: II  Anesthesia Plan: Spinal   Post-op Pain Management:    Induction: Intravenous  PONV Risk Score and Plan: 3 and TIVA and Propofol infusion  Airway Management Planned: Natural Airway and Simple Face Mask  Additional Equipment:   Intra-op Plan:   Post-operative  Plan:   Informed Consent: I have reviewed the patients History and Physical, chart, labs and discussed the procedure including the risks, benefits and alternatives for the proposed anesthesia with the patient or authorized representative who has indicated his/her understanding and acceptance.     Dental advisory given  Plan Discussed with: CRNA and Anesthesiologist  Anesthesia Plan Comments:         Anesthesia Quick Evaluation

## 2021-02-12 NOTE — Evaluation (Signed)
Physical Therapy Evaluation Patient Details Name: Susan Ayala MRN: [id number] DOB: 08/08/1934 Today's Date: 02/12/2021   History of Present Illness  Pt is an 87 yo female s/p L THA, WBAT posterior approach. PMH of HTN, anxiety, depression, osteoporosis.  Clinical Impression  Pt alert, in bed, RN at bedside. Pt reported no pain, just some soreness in her hip. Per pt at baseline she is independent (has been using a rollator due to increased pain). Lives alone, but her daughter is coming to stay with her at discharge.  The patient demonstrated supine to sit with supervision. Educated on hip precautions in sitting as well as weight bearing status. Sit <> stand from EOB with RW and CGA. She was able to ambulate ~138ft with RW and CGA, did report some nausea towards end of ambulation. Exhibited antalgic gait and decreased step length on LLE. Returned to sitting in recliner with cues due to leaving RW outside BOS. Able to perform isometric exercises with verbal cueing. In chair with all needs in reach.  Overall the patient demonstrated deficits (see "PT Problem List") that impede the patient's functional abilities, safety, and mobility and would benefit from skilled PT intervention. Recommendation is HHPT with intermittent supervision.      Follow Up Recommendations Home health PT;Supervision - Intermittent    Equipment Recommendations  None recommended by PT    Recommendations for Other Services       Precautions / Restrictions Precautions Precautions: Fall;Posterior Hip Precaution Booklet Issued: No Restrictions Weight Bearing Restrictions: Yes LLE Weight Bearing: Weight bearing as tolerated      Mobility  Bed Mobility Overal bed mobility: Needs Assistance Bed Mobility: Supine to Sit     Supine to sit: Supervision;HOB elevated          Transfers Overall transfer level: Needs assistance Equipment used: Rolling walker (2 wheeled) Transfers: Sit to/from Stand Sit to Stand:  Min guard         General transfer comment: from EOB  Ambulation/Gait Ambulation/Gait assistance: Min guard Gait Distance (Feet): 100 Feet Assistive device: Rolling walker (2 wheeled) Gait Pattern/deviations: Antalgic     General Gait Details: educated on weight bearing status, decreased step length on LLE no unsteadiness noted  Stairs            Wheelchair Mobility    Modified Rankin (Stroke Patients Only)       Balance Overall balance assessment: Needs assistance Sitting-balance support: Feet supported Sitting balance-Leahy Scale: Good       Standing balance-Leahy Scale: Fair Standing balance comment: RW for safety                             Pertinent Vitals/Pain Pain Assessment:  (pt denied pain, just soreness)    Home Living Family/patient expects to be discharged to:: Private residence Living Arrangements: Alone Available Help at Discharge: Family;Other (Comment) (daughter is coming to stay with her to assist at discharge) Type of Home: House Home Access: Stairs to enter Entrance Stairs-Rails: Right Entrance Stairs-Number of Steps: 1 +1 Home Layout: Multi-level Home Equipment: Toilet riser;Walker - 2 wheels;Walker - 4 wheels;Bedside commode;Grab bars - tub/shower;Grab bars - toilet      Prior Function Level of Independence: Independent               Hand Dominance   Dominant Hand: Right    Extremity/Trunk Assessment   Upper Extremity Assessment Upper Extremity Assessment: Overall WFL for tasks assessed  Lower Extremity Assessment Lower Extremity Assessment: RLE deficits/detail;LLE deficits/detail RLE Deficits / Details: WFLs LLE Deficits / Details: s/p THA    Cervical / Trunk Assessment Cervical / Trunk Assessment: Normal  Communication   Communication: No difficulties  Cognition Arousal/Alertness: Awake/alert Behavior During Therapy: WFL for tasks assessed/performed Overall Cognitive Status: Within Functional  Limits for tasks assessed                                        General Comments      Exercises Total Joint Exercises Ankle Circles/Pumps: AROM;Strengthening;Both;10 reps Quad Sets: AROM;Strengthening;Both;10 reps Gluteal Sets: AROM;Strengthening;Both;10 reps   Assessment/Plan    PT Assessment Patient needs continued PT services  PT Problem List Decreased strength;Decreased mobility;Decreased range of motion;Decreased activity tolerance;Decreased balance;Pain;Decreased knowledge of use of DME;Decreased knowledge of precautions       PT Treatment Interventions DME instruction;Therapeutic exercise;Balance training;Gait training;Stair training;Neuromuscular re-education;Functional mobility training;Therapeutic activities;Patient/family education    PT Goals (Current goals can be found in the Care Plan section)  Acute Rehab PT Goals Patient Stated Goal: to go home PT Goal Formulation: With patient Time For Goal Achievement: 02/26/21 Potential to Achieve Goals: Good    Frequency BID   Barriers to discharge        Co-evaluation               AM-PAC PT "6 Clicks" Mobility  Outcome Measure Help needed turning from your back to your side while in a flat bed without using bedrails?: None Help needed moving from lying on your back to sitting on the side of a flat bed without using bedrails?: None Help needed moving to and from a bed to a chair (including a wheelchair)?: None Help needed standing up from a chair using your arms (e.g., wheelchair or bedside chair)?: A Little Help needed to walk in hospital room?: A Little Help needed climbing 3-5 steps with a railing? : A Little 6 Click Score: 21    End of Session Equipment Utilized During Treatment: Gait belt Activity Tolerance: Patient tolerated treatment well Patient left: in chair;with nursing/sitter in room Nurse Communication: Mobility status PT Visit Diagnosis: Other abnormalities of gait and  mobility (R26.89);Muscle weakness (generalized) (M62.81);Difficulty in walking, not elsewhere classified (R26.2);Pain Pain - Right/Left: Left Pain - part of body: Hip    Time: 8921-1941 PT Time Calculation (min) (ACUTE ONLY): 40 min   Charges:   PT Evaluation $PT Eval Low Complexity: 1 Low PT Treatments $Gait Training: 8-22 mins $Therapeutic Exercise: 8-22 mins        Lieutenant Diego PT, DPT 4:10 PM,02/12/21

## 2021-02-12 NOTE — Op Note (Signed)
OPERATIVE NOTE  DATE OF SURGERY:  02/12/2021  PATIENT NAME:  Susan Ayala   DOB: 09/18/1934  MRN: [id number]  PRE-OPERATIVE DIAGNOSIS: Degenerative arthrosis of the left hip, primary  POST-OPERATIVE DIAGNOSIS:  Same  PROCEDURE:  Left total hip arthroplasty  SURGEON:  Marciano Sequin. M.D.  ASSISTANT: Cassell Smiles, PA-C (present and scrubbed throughout the case, critical for assistance with exposure, retraction, instrumentation, and closure)  ANESTHESIA: spinal  ESTIMATED BLOOD LOSS: 100 mL  FLUIDS REPLACED: 800 mL of crystalloid  DRAINS: 2 medium Hemovac  IMPLANTS UTILIZED: DePuy 13.5 mm small stature AML femoral stem, 52 mm OD Pinnacle 100 acetabular component, neutral Pinnacle Altrx polyethylene insert, and a 36 mm M-SPEC +1.5 mm hip ball  INDICATIONS FOR SURGERY: Susan Ayala is a 87 y.o. year old female with a long history of progressive hip and groin  pain. X-rays demonstrated severe degenerative changes. The patient had not seen any significant improvement despite conservative nonsurgical intervention. After discussion of the risks and benefits of surgical intervention, the patient expressed understanding of the risks benefits and agree with plans for total hip arthroplasty.   The risks, benefits, and alternatives were discussed at length including but not limited to the risks of infection, bleeding, nerve injury, stiffness, blood clots, the need for revision surgery, limb length inequality, dislocation, cardiopulmonary complications, among others, and they were willing to proceed.  PROCEDURE IN DETAIL: The patient was brought into the operating room and, after adequate spinal anesthesia was achieved, the patient was placed in a right lateral decubitus position. Axillary roll was placed and all bony prominences were well-padded. The patient's left hip was cleaned and prepped with alcohol and DuraPrep and draped in the usual sterile fashion. A "timeout" was performed as per  usual protocol. A lateral curvilinear incision was made gently curving towards the posterior superior iliac spine. The IT band was incised in line with the skin incision and the fibers of the gluteus maximus were split in line. The piriformis tendon was identified, skeletonized, and incised at its insertion to the proximal femur and reflected posteriorly. A T type posterior capsulotomy was performed. Prior to dislocation of the femoral head, a threaded Steinmann pin was inserted through a separate stab incision into the pelvis superior to the acetabulum and bent in the form of a stylus so as to assess limb length and hip offset throughout the procedure. The femoral head was then dislocated posteriorly. Inspection of the femoral head demonstrated severe degenerative changes with full-thickness loss of articular cartilage. The femoral neck cut was performed using an oscillating saw. The anterior capsule was elevated off of the femoral neck using a periosteal elevator. Attention was then directed to the acetabulum. The remnant of the labrum was excised using electrocautery. Inspection of the acetabulum also demonstrated significant degenerative changes. The acetabulum was reamed in sequential fashion up to a 51 mm diameter. Good punctate bleeding bone was encountered. A 52 mm Pinnacle 100 acetabular component was positioned and impacted into place. Good scratch fit was appreciated. A neutral polyethylene trial was inserted.  Attention was then directed to the proximal femur. A hole for reaming of the proximal femoral canal was created using a high-speed burr. The femoral canal was reamed in sequential fashion up to a 13 mm diameter. This allowed for approximately 6 cm of scratch fit. Serial broaches were inserted up to a 13.5 mm small stature femoral broach. Calcar region was planed and a trial reduction was performed using a 36 mm hip  ball with a +1.5 mm neck length. Good equalization of limb lengths and hip offset  was appreciated and excellent stability was noted both anteriorly and posteriorly. Trial components were removed. The acetabular shell was irrigated with copious amounts of normal saline with antibiotic solution and suctioned dry. A neutral Pinnacle Altrx polyethylene insert was positioned and impacted into place. Next, a 13.5 mm small stature AML femoral stem was positioned and impacted into place. Excellent scratch fit was appreciated. A trial reduction was again performed with a 36 mm hip ball with a +1.5 mm neck length. Again, good equalization of limb lengths was appreciated and excellent stability appreciated both anteriorly and posteriorly. The hip was then dislocated and the trial hip ball was removed. The Morse taper was cleaned and dried. A 36 mm M-SPEC hip ball with a +1.5 mm neck length was placed on the trunnion and impacted into place. The hip was then reduced and placed through range of motion. Excellent stability was appreciated both anteriorly and posteriorly.  The wound was irrigated with copious amounts of normal saline followed by 500 ml of Surgiphor and suctioned dry. Good hemostasis was appreciated. The posterior capsulotomy was repaired using #5 Ethibond. Piriformis tendon was reapproximated to the undersurface of the gluteus medius tendon using #5 Ethibond. The IT band was reapproximated using interrupted sutures of #1 Vicryl. Subcutaneous tissue was approximated using first #0 Vicryl followed by #2-0 Vicryl. The skin was closed with skin staples.  The patient tolerated the procedure well and was transported to the recovery room in stable condition.   Marciano Sequin., M.D.

## 2021-02-12 NOTE — Anesthesia Procedure Notes (Signed)
Spinal  Patient location during procedure: OR Start time: 02/12/2021 7:22 AM End time: 02/12/2021 7:26 AM Staffing Performed: resident/CRNA  Anesthesiologist: Martha Clan, MD Resident/CRNA: Lia Foyer, CRNA Preanesthetic Checklist Completed: patient identified, IV checked, site marked, risks and benefits discussed, surgical consent, monitors and equipment checked, pre-op evaluation and timeout performed Spinal Block Patient position: sitting Prep: DuraPrep Patient monitoring: heart rate, cardiac monitor, continuous pulse ox and blood pressure Approach: midline Location: L3-4 Injection technique: single-shot Needle Needle type: Pencan  Needle gauge: 25 G Needle length: 9 cm Assessment Sensory level: T4

## 2021-02-12 NOTE — H&P (Signed)
The patient has been re-examined, and the chart reviewed, and there have been no interval changes to the documented history and physical.    The risks, benefits, and alternatives have been discussed at length. The patient expressed understanding of the risks benefits and agreed with plans for surgical intervention.  Cairo Agostinelli P. Riyaan Heroux, Jr. M.D.    

## 2021-02-12 NOTE — Progress Notes (Signed)
Physical Therapy Treatment Patient Details Name: Susan Ayala MRN: [id number] DOB: [birth year] Today's Date: 02/12/2021    History of Present Illness Pt is an 87 yo female s/p L THA, WBAT posterior approach. PMH of HTN, anxiety, depression, osteoporosis.    PT Comments    Pt asked PT for assistance to bathroom, still denied true hip pain just soreness prior to mobility. Sit <> stand from recliner CGA with great awareness of hip precautions. She ambulated to bathroom <> recliner ~14ft total with CGA and RW. modA to sit <> Stand from commode twice with use of grab bar and RW, unable to urinate at this time. She did wash her hands at the sink exhibiting fair  Balance in standing. Returned to sitting and repositioned for comfort. The patient would benefit from further skilled PT to continue to progress toward goals.       Follow Up Recommendations  Home health PT;Supervision - Intermittent     Equipment Recommendations  None recommended by PT    Recommendations for Other Services       Precautions / Restrictions Precautions Precautions: Fall;Posterior Hip Precaution Booklet Issued: No Restrictions Weight Bearing Restrictions: Yes LLE Weight Bearing: Weight bearing as tolerated    Mobility  Bed Mobility General bed mobility comments: pt in recliner at start/end    Transfers Overall transfer level: Needs assistance Equipment used: Rolling walker (2 wheeled) Transfers: Sit to/from Stand Sit to Stand: Min guard;Mod assist         General transfer comment: from recliner CGA, modA from standard commode twice. Pt with good awareness of precautions  Ambulation/Gait Ambulation/Gait assistance: Min guard Gait Distance (Feet): 60 Feet Assistive device: Rolling walker (2 wheeled) Gait Pattern/deviations: Antalgic     General Gait Details: no unsteadiness noted   Stairs             Wheelchair Mobility    Modified Rankin (Stroke Patients Only)       Balance  Overall balance assessment: Needs assistance Sitting-balance support: Feet supported Sitting balance-Leahy Scale: Good Sitting balance - Comments: able to shift on commode with UE support, wipe without assistance. stand at sink and wash hands with CGA     Standing balance-Leahy Scale: Fair Standing balance comment: RW for safety, stood at sink and wash hands with CGA                            Cognition Arousal/Alertness: Awake/alert Behavior During Therapy: WFL for tasks assessed/performed Overall Cognitive Status: Within Functional Limits for tasks assessed                                        Exercises Total Joint Exercises Ankle Circles/Pumps: AROM;Strengthening;Both;10 reps Quad Sets: AROM;Strengthening;Both;10 reps Gluteal Sets: AROM;Strengthening;Both;10 reps    General Comments        Pertinent Vitals/Pain Pain Assessment:  (soreness reported no pain)    Home Living Family/patient expects to be discharged to:: Private residence Living Arrangements: Alone Available Help at Discharge: Family;Other (Comment) (daughter is coming to stay with her to assist at discharge) Type of Home: House Home Access: Stairs to enter Entrance Stairs-Rails: Right Home Layout: Multi-level Home Equipment: Toilet riser;Walker - 2 wheels;Walker - 4 wheels;Bedside commode;Grab bars - tub/shower;Grab bars - toilet      Prior Function Level of Independence: Independent  PT Goals (current goals can now be found in the care plan section) Acute Rehab PT Goals Patient Stated Goal: to go home PT Goal Formulation: With patient Time For Goal Achievement: 02/26/21 Potential to Achieve Goals: Good Progress towards PT goals: Progressing toward goals    Frequency    BID      PT Plan Current plan remains appropriate    Co-evaluation              AM-PAC PT "6 Clicks" Mobility   Outcome Measure  Help needed turning from your back to your  side while in a flat bed without using bedrails?: None Help needed moving from lying on your back to sitting on the side of a flat bed without using bedrails?: None Help needed moving to and from a bed to a chair (including a wheelchair)?: None Help needed standing up from a chair using your arms (e.g., wheelchair or bedside chair)?: A Little Help needed to walk in hospital room?: A Little Help needed climbing 3-5 steps with a railing? : A Little 6 Click Score: 21    End of Session Equipment Utilized During Treatment: Gait belt Activity Tolerance: Patient tolerated treatment well Patient left: in chair Nurse Communication: Mobility status PT Visit Diagnosis: Other abnormalities of gait and mobility (R26.89);Muscle weakness (generalized) (M62.81);Difficulty in walking, not elsewhere classified (R26.2);Pain Pain - Right/Left: Left Pain - part of body: Hip     Time: 6834-1962 PT Time Calculation (min) (ACUTE ONLY): 14 min  Charges:  $Gait Training: 8-22 mins $Therapeutic Exercise: 8-22 mins                     Lieutenant Diego PT, DPT 4:16 PM,02/12/21

## 2021-02-13 DIAGNOSIS — Z9841 Cataract extraction status, right eye: Secondary | ICD-10-CM | POA: Diagnosis not present

## 2021-02-13 DIAGNOSIS — Z9842 Cataract extraction status, left eye: Secondary | ICD-10-CM | POA: Diagnosis not present

## 2021-02-13 DIAGNOSIS — L80 Vitiligo: Secondary | ICD-10-CM | POA: Diagnosis not present

## 2021-02-13 DIAGNOSIS — I10 Essential (primary) hypertension: Secondary | ICD-10-CM | POA: Diagnosis not present

## 2021-02-13 DIAGNOSIS — K219 Gastro-esophageal reflux disease without esophagitis: Secondary | ICD-10-CM | POA: Diagnosis not present

## 2021-02-13 DIAGNOSIS — Z79899 Other long term (current) drug therapy: Secondary | ICD-10-CM | POA: Diagnosis not present

## 2021-02-13 DIAGNOSIS — J302 Other seasonal allergic rhinitis: Secondary | ICD-10-CM | POA: Diagnosis not present

## 2021-02-13 DIAGNOSIS — Z9109 Other allergy status, other than to drugs and biological substances: Secondary | ICD-10-CM | POA: Diagnosis not present

## 2021-02-13 DIAGNOSIS — M25552 Pain in left hip: Secondary | ICD-10-CM | POA: Diagnosis present

## 2021-02-13 DIAGNOSIS — Z8261 Family history of arthritis: Secondary | ICD-10-CM | POA: Diagnosis not present

## 2021-02-13 DIAGNOSIS — E785 Hyperlipidemia, unspecified: Secondary | ICD-10-CM | POA: Diagnosis not present

## 2021-02-13 DIAGNOSIS — M1612 Unilateral primary osteoarthritis, left hip: Secondary | ICD-10-CM | POA: Diagnosis not present

## 2021-02-13 DIAGNOSIS — N6019 Diffuse cystic mastopathy of unspecified breast: Secondary | ICD-10-CM | POA: Diagnosis not present

## 2021-02-13 DIAGNOSIS — Z86010 Personal history of colonic polyps: Secondary | ICD-10-CM | POA: Diagnosis not present

## 2021-02-13 DIAGNOSIS — Z90710 Acquired absence of both cervix and uterus: Secondary | ICD-10-CM | POA: Diagnosis not present

## 2021-02-13 DIAGNOSIS — Z96641 Presence of right artificial hip joint: Secondary | ICD-10-CM | POA: Diagnosis not present

## 2021-02-13 DIAGNOSIS — Z791 Long term (current) use of non-steroidal anti-inflammatories (NSAID): Secondary | ICD-10-CM | POA: Diagnosis not present

## 2021-02-13 DIAGNOSIS — M810 Age-related osteoporosis without current pathological fracture: Secondary | ICD-10-CM | POA: Diagnosis not present

## 2021-02-13 DIAGNOSIS — Z885 Allergy status to narcotic agent status: Secondary | ICD-10-CM | POA: Diagnosis not present

## 2021-02-13 DIAGNOSIS — Z85828 Personal history of other malignant neoplasm of skin: Secondary | ICD-10-CM | POA: Diagnosis not present

## 2021-02-13 DIAGNOSIS — E7800 Pure hypercholesterolemia, unspecified: Secondary | ICD-10-CM | POA: Diagnosis not present

## 2021-02-13 DIAGNOSIS — Z888 Allergy status to other drugs, medicaments and biological substances status: Secondary | ICD-10-CM | POA: Diagnosis not present

## 2021-02-13 DIAGNOSIS — F32A Depression, unspecified: Secondary | ICD-10-CM | POA: Diagnosis not present

## 2021-02-13 MED ORDER — CELECOXIB 200 MG PO CAPS: 200.0000 mg | ORAL_CAPSULE | Freq: Two times a day (BID) | ORAL | 0 refills | Status: DC

## 2021-02-13 MED ORDER — TRAMADOL HCL 50 MG PO TABS
50.0000 mg | ORAL_TABLET | ORAL | 0 refills | Status: DC | PRN
Start: 2021-02-13 — End: 2021-09-05

## 2021-02-13 MED ORDER — ENOXAPARIN SODIUM 40 MG/0.4ML ~~LOC~~ SOLN: 40.0000 mg | SUBCUTANEOUS | 0 refills | Status: DC

## 2021-02-13 MED ORDER — SODIUM CHLORIDE 0.9 % IV SOLN
INTRAVENOUS | Status: DC | PRN
  Administered 2021-02-13: 250 mL via INTRAVENOUS

## 2021-02-13 MED ORDER — OXYCODONE HCL 5 MG PO TABS: 5.0000 mg | ORAL_TABLET | ORAL | 0 refills | Status: DC | PRN

## 2021-02-13 NOTE — TOC Initial Note (Signed)
Transition of Care [facility]) - Initial/Assessment Note    Patient Details  Name: Susan Ayala MRN: [id number] Date of Birth: 05-08-1934  [facility] CM/SW Contact:    Shelbie Ammons, RN Phone Number: 02/13/2021, 11:27 AM  Clinical Narrative:   RNCM assessed patient at bedside, with daughter present. Patient lives at home and is normally independent in all of her ADLs. Patient reports that she has already been contacted by [facility] and is planning on using them for home health. She denies the need for any equipment at this time.                 Expected Discharge Plan: [place] Barriers to Discharge: No Barriers Identified   Patient Goals and CMS Choice        Expected Discharge Plan and Services Expected Discharge Plan: [place]       Living arrangements for the past 2 months: Single Family Home                           HH Arranged: PT,OT [place] Agency: [facility] (formerly [employer]) Date [place]: 02/13/21 Time [place]: [place] Representative spoke with at [place]: [place] Arrangements/Services Living arrangements for the past 2 months: [place] Lives with:: Self Patient language and need for interpreter reviewed:: Yes Do you feel safe going back to the place where you live?: Yes      Need for Family Participation in Patient Care: Yes (Comment) Care giver support system in place?: Yes (comment)   Criminal Activity/Legal Involvement Pertinent to Current Situation/Hospitalization: No - Comment as needed  Activities of Daily Living Home Assistive Devices/Equipment: Walker (specify type) (walker and reading glasses) ADL Screening (condition at time of admission) Patient's cognitive ability adequate to safely complete daily activities?: Yes Is the patient deaf or have difficulty hearing?: No Does the patient have difficulty seeing, even when wearing  glasses/contacts?: No Does the patient have difficulty concentrating, remembering, or making decisions?: No Patient able to express need for assistance with ADLs?: Yes Does the patient have difficulty dressing or bathing?: No Independently performs ADLs?: Yes (appropriate for developmental age) Does the patient have difficulty walking or climbing stairs?: No Weakness of Legs: None Weakness of Arms/Hands: None  Permission Sought/Granted                  Emotional Assessment Appearance:: Appears stated age     Orientation: : Oriented to Self,Oriented to Place,Oriented to  Time,Oriented to Situation Alcohol / Substance Use: Not Applicable Psych Involvement: No (comment)  Admission diagnosis:  Hx of total hip arthroplasty, left [I68.032] Patient Active Problem List   Diagnosis Date Noted  . Hx of total hip arthroplasty, left 02/12/2021  . Colon polyp 02/11/2021  . Fibrocystic breast 02/11/2021  . Hyperlipidemia 02/11/2021  . Osteoarthritis 02/11/2021  . Recurrent major depressive disorder, in full remission ([place]) 02/11/2021  . Skin cancer of face 02/11/2021  . Primary osteoarthritis of left hip 11/25/2020  . Diuretic-induced hypokalemia 11/12/2017  . Impaired glucose tolerance 11/12/2017  . Vertigo 09/09/2017  . High risk medication use 06/14/2015  . Seasonal allergic rhinitis 06/14/2015  . Anxiety 12/12/2014  . Benign essential hypertension 06/06/2014  . Osteopenia 06/06/2014  . Status post right hip replacement 05/04/2014   PCP:  Ezequiel Kayser, MD Pharmacy:   [place], [place] -  [place] [address] Phone: [phone] Fax: [phone]     Social Determinants of Health (SDOH) Interventions    Readmission Risk Interventions No flowsheet data found.

## 2021-02-13 NOTE — Anesthesia Postprocedure Evaluation (Signed)
Anesthesia Post Note  Patient: Susan Ayala  Procedure(s) Performed: TOTAL HIP ARTHROPLASTY (Left Hip)  Patient location during evaluation: Nursing Unit Anesthesia Type: Spinal Level of consciousness: oriented Pain management: pain level controlled Vital Signs Assessment: post-procedure vital signs reviewed and stable Respiratory status: spontaneous breathing and respiratory function stable Cardiovascular status: blood pressure returned to baseline and stable Postop Assessment: no headache, no backache, no apparent nausea or vomiting and patient able to bend at knees Anesthetic complications: no   No complications documented.   Last Vitals:  Vitals:   02/12/21 2304 02/13/21 0359  BP: 107/60 122/69  Pulse: 91 87  Resp: 18 18  Temp: 36.5 C 36.6 C  SpO2: 92% 94%    Last Pain:  Vitals:   02/12/21 2052  TempSrc:   PainSc: 6                  Alison Stalling

## 2021-02-13 NOTE — Discharge Summary (Signed)
Physician Discharge Summary  Patient ID: Susan Ayala MRN: [id number] DOB/AGE: 08/03/1934 87 y.o.  Admit date: 02/12/2021 Discharge date: 02/13/2021  Admission Diagnoses:  Hx of total hip arthroplasty, left [Z96.642]  Surgeries:Procedure(s): Left total hip arthroplasty  SURGEON:  Marciano Sequin. M.D.  ASSISTANT: Cassell Smiles, PA-C (present and scrubbed throughout the case, critical for assistance with exposure, retraction, instrumentation, and closure)  ANESTHESIA: spinal  ESTIMATED BLOOD LOSS: 100 mL  FLUIDS REPLACED: 800 mL of crystalloid  DRAINS: 2 medium Hemovac  IMPLANTS UTILIZED: DePuy 13.5 mm small stature AML femoral stem, 52 mm OD Pinnacle 100 acetabular component, neutral Pinnacle Altrx polyethylene insert, and a 36 mm M-SPEC +1.5 mm hip ball  Discharge Diagnoses: Patient Active Problem List   Diagnosis Date Noted  . Hx of total hip arthroplasty, left 02/12/2021  . Colon polyp 02/11/2021  . Fibrocystic breast 02/11/2021  . Hyperlipidemia 02/11/2021  . Osteoarthritis 02/11/2021  . Recurrent major depressive disorder, in full remission ([place]) 02/11/2021  . Skin cancer of face 02/11/2021  . Primary osteoarthritis of left hip 11/25/2020  . Diuretic-induced hypokalemia 11/12/2017  . Impaired glucose tolerance 11/12/2017  . Vertigo 09/09/2017  . High risk medication use 06/14/2015  . Seasonal allergic rhinitis 06/14/2015  . Anxiety 12/12/2014  . Benign essential hypertension 06/06/2014  . Osteopenia 06/06/2014  . Status post right hip replacement 05/04/2014    Past Medical History:  Diagnosis Date  . Anemia    H/O  . Arthritis   . Depression   . GERD (gastroesophageal reflux disease)    OCC  . Hypercholesterolemia   . Hypertension   . Osteoporosis   . Pneumonia    AGE 27  . PONV (postoperative nausea and vomiting)    SEVERE  . Syncope 2002   DUE TO DEHYDRATION-NEGATIVE W/U BY DR Ubaldo Glassing     Transfusion: n/a   Consultants (if any):    Discharged Condition: Improved  Hospital Course: Susan Ayala is an 87 y.o. female who was admitted 02/12/2021 with a diagnosis of left hip osteoarthritis and went to the operating room on 02/12/2021 and underwent left total hip arthroplasty through posterior approach. The patient received perioperative antibiotics for prophylaxis (see below). The patient tolerated the procedure well and was transported to PACU in stable condition. After meeting PACU criteria, the patient was subsequently transferred to the Orthopaedics/Rehabilitation unit.   The patient received DVT prophylaxis in the form of early mobilization, Lovenox, Foot Pumps and TED hose. A sacral pad had been placed and heels were elevated off of the bed with rolled towels in order to protect skin integrity. Foley catheter was discontinued on postoperative day #0. Wound drains were discontinued on postoperative day #1. The surgical incision was healing well without signs of infection.  Physical therapy was initiated postoperatively for transfers, gait training, and strengthening. Occupational therapy was initiated for activities of daily living and evaluation for assisted devices. Rehabilitation goals were reviewed in detail with the patient. The patient made steady progress with physical therapy and physical therapy recommended discharge to Home.   The patient achieved the preliminary goals of this hospitalization and was felt to be medically and orthopaedically appropriate for discharge.  She was given perioperative antibiotics:  Anti-infectives (From admission, onward)   Start     Dose/Rate Route Frequency Ordered Stop   02/12/21 1330  ceFAZolin (ANCEF) IVPB 2g/100 mL premix        2 g 200 mL/hr over 30 Minutes Intravenous Every 6 hours 02/12/21 1229 02/13/21  0129   02/12/21 1145  ceFAZolin (ANCEF) IVPB 2g/100 mL premix  Status:  Discontinued        2 g 200 mL/hr over 30 Minutes Intravenous Every 6 hours 02/12/21 1131 02/12/21 1229    02/12/21 0625  ceFAZolin (ANCEF) 2-4 GM/100ML-% IVPB       Note to Pharmacy: Sylvester Harder   : cabinet override      02/12/21 0625 02/12/21 0735   02/12/21 0600  ceFAZolin (ANCEF) IVPB 2g/100 mL premix        2 g 200 mL/hr over 30 Minutes Intravenous On call to O.R. 02/12/21 [id number] 02/12/21 0731    .  Recent vital signs:  Vitals:   02/13/21 1250 02/13/21 1514  BP: (!) 155/73 105/65  Pulse: 94 76  Resp: 16 16  Temp: 98.1 F (36.7 C) 98.1 F (36.7 C)  SpO2: 97% 100%    Recent laboratory studies:  No results for input(s): WBC, HGB, HCT, PLT, K, CL, CO2, BUN, CREATININE, GLUCOSE, CALCIUM, LABPT, INR in the last 72 hours.  Diagnostic Studies: DG Hip Port Unilat With Pelvis 1V Left  Result Date: 02/12/2021 CLINICAL DATA:  Status post left hip arthroplasty. EXAM: DG HIP (WITH OR WITHOUT PELVIS) 1V PORT LEFT COMPARISON:  None. FINDINGS: Alignment of a left total hip arthroplasty appears normal. No evidence of fracture or abnormal bony lucency. Right hip arthroplasty present. IMPRESSION: Normal alignment of left total hip arthroplasty. Electronically Signed   By: Aletta Edouard M.D.   On: 02/12/2021 11:27    Discharge Medications:   Allergies as of 02/13/2021      Reactions   Codeine Nausea And Vomiting   Adhesive [tape] Itching, Rash   On tender skin.   Zolpidem    Other reaction(s): Headache      Medication List    STOP taking these medications   aspirin 81 MG chewable tablet   meloxicam 7.5 MG tablet Commonly known as: MOBIC     TAKE these medications   acetaminophen 650 MG CR tablet Commonly known as: TYLENOL Take 1,300 mg by mouth in the morning and at bedtime.   amoxicillin 500 MG capsule Commonly known as: AMOXIL Take 2,000 mg by mouth as needed. PRIOR TO DENTAL PROCEDURES   Calcium Carb-Cholecalciferol 600-800 MG-UNIT Tabs Take 1 tablet by mouth daily.   calcium carbonate 500 MG chewable tablet Commonly known as: TUMS - dosed in mg elemental calcium Chew 1  tablet by mouth as needed for indigestion or heartburn.   celecoxib 200 MG capsule Commonly known as: CELEBREX Take 1 capsule (200 mg total) by mouth 2 (two) times daily.   diclofenac Sodium 1 % Gel Commonly known as: VOLTAREN Apply 1 application topically daily as needed (pain).   doxepin 10 MG capsule Commonly known as: SINEQUAN Take 10 mg by mouth at bedtime.   enoxaparin 40 MG/0.4ML injection Commonly known as: LOVENOX Inject 0.4 mLs (40 mg total) into the skin daily for 14 days.   hydrochlorothiazide 25 MG tablet Commonly known as: HYDRODIURIL Take 25 mg by mouth every morning.   lovastatin 40 MG tablet Commonly known as: MEVACOR Take 40 mg by mouth at bedtime.   meclizine 25 MG tablet Commonly known as: ANTIVERT Take 25 mg by mouth 3 (three) times daily as needed for dizziness.   metoprolol succinate 50 MG 24 hr tablet Commonly known as: TOPROL-XL Take 50 mg by mouth every evening.   MULTI-VITAMIN/MINERALS PO Take 1 tablet by mouth daily.   oxyCODONE 5 MG  immediate release tablet Commonly known as: Oxy IR/ROXICODONE Take 1 tablet (5 mg total) by mouth every 4 (four) hours as needed for severe pain.   Potassium Chloride ER 20 MEQ Tbcr Take 40 mEq by mouth daily.   sertraline 50 MG tablet Commonly known as: ZOLOFT Take 75 mg by mouth every morning.   traMADol 50 MG tablet Commonly known as: ULTRAM Take 1 tablet (50 mg total) by mouth every 4 (four) hours as needed for moderate pain.   Vitamin D3 25 MCG (1000 UT) Caps Take 1,000 Units by mouth daily.            Durable Medical Equipment  (From admission, onward)         Start     Ordered   02/12/21 1214  DME Walker rolling  Once       Question:  Patient needs a walker to treat with the following condition  Answer:  S/P total hip arthroplasty   02/12/21 1213   02/12/21 1214  DME Bedside commode  Once       Question:  Patient needs a bedside commode to treat with the following condition  Answer:   S/P total hip arthroplasty   02/12/21 1213          Disposition: home with home health therapy      Follow-up Information    Dereck Leep, MD On 03/27/2021.   Specialty: Orthopedic Surgery Why: at 1:45pm Contact information: [place] [address] [place], PA-C 02/13/2021, 5:42 PM

## 2021-02-13 NOTE — Progress Notes (Signed)
Pt discharged home with daughter, all instructions provided with questions answered.   BP 105/65 (BP Location: Right Arm)   Pulse 76   Temp 98.1 F (36.7 C)   Resp 16   Ht 5\' 5"  (1.651 m)   Wt 72.6 kg   SpO2 100%   BMI 26.63 kg/m

## 2021-02-13 NOTE — Plan of Care (Signed)
Patient doing well, ready for discharge. Compression dressing applied over drain sites. Drain already pulled out.

## 2021-02-13 NOTE — Plan of Care (Signed)

## 2021-02-13 NOTE — Progress Notes (Signed)
Physical Therapy Treatment Patient Details Name: Susan Ayala MRN: [id number] DOB: 01-25-1934 Today's Date: 02/13/2021    History of Present Illness Pt is an 87 yo female s/p L THA, WBAT posterior approach. PMH of HTN, anxiety, depression, osteoporosis.    PT Comments    Patient agreeable to second PT session today. Patient is hopeful to be discharged home later today. Patient ambulated around nursing unit using rolling walker with occasional cues for safety. Reviewed transfer techniques with patient and education on how to maintain posterior hip precautions with functional mobility. Recommend to continue PT if patient if patient is not discharge home. Stair training completed in morning session today. Recommend HHPT at discharge and patient will have assistance from family as needed.     Follow Up Recommendations  Home health PT;Supervision - Intermittent     Equipment Recommendations  None recommended by PT    Recommendations for Other Services       Precautions / Restrictions Precautions Precautions: Fall;Posterior Hip Precaution Booklet Issued: Yes (comment) Restrictions Weight Bearing Restrictions: Yes LLE Weight Bearing: Weight bearing as tolerated    Mobility  Bed Mobility Overal bed mobility: Needs Assistance Bed Mobility: Supine to Sit;Sit to Supine     Supine to sit: Min guard Sit to supine: Min assist   General bed mobility comments: assistance for LLE support to return to bed. verbal cues for sequencing and technique.    Transfers Overall transfer level: Needs assistance Equipment used: Rolling walker (2 wheeled) Transfers: Sit to/from Stand Sit to Stand: Min assist         General transfer comment: patient did require phyiscal assistance for standing this session likely due to mild soreness reported in left hip. lifting assistance required for standing. verbal cues for hand placement and LLE positioning during  transfers  Ambulation/Gait Ambulation/Gait assistance: Supervision Gait Distance (Feet): 175 Feet Assistive device: Rolling walker (2 wheeled) Gait Pattern/deviations: Step-through pattern;Antalgic;Decreased stance time - left Gait velocity: WNL   General Gait Details: occasional cues for proper rolling walker use with carry over demonstrated with ambulation. no loss of balance   Stairs Stairs: Yes Stairs assistance: Supervision Stair Management: Two rails;Step to pattern Number of Stairs: 4 General stair comments: Pt was easily able to ascend/descend 4 stair without physical assistance or cues. pt rremember proper sequencing from previous hip replacement   Wheelchair Mobility    Modified Rankin (Stroke Patients Only)       Balance Overall balance assessment: Modified Independent             Standing balance comment: no LOB with static standing even without UE support                            Cognition Arousal/Alertness: Awake/alert Behavior During Therapy: WFL for tasks assessed/performed Overall Cognitive Status: Within Functional Limits for tasks assessed                                 General Comments: Pt is A and O x 4 and extremely pleasant and cooperative. knew 2/3 hip      Exercises Total Joint Exercises Ankle Circles/Pumps: AROM;Strengthening;Both;10 reps Quad Sets: AROM;Strengthening;Both;10 reps Gluteal Sets: AROM;Strengthening;Both;10 reps Hip ABduction/ADduction: AROM;10 reps    General Comments        Pertinent Vitals/Pain Pain Assessment: Faces Faces Pain Scale: Hurts a little bit Pain Location: left hip Pain Descriptors /  Indicators: Sore Pain Intervention(s): Ice applied (ice pack applied to left hip after mobilizing)    Home Living                      Prior Function            PT Goals (current goals can now be found in the care plan section) Acute Rehab PT Goals Patient Stated Goal: to go  home Progress towards PT goals: Progressing toward goals    Frequency    BID      PT Plan Current plan remains appropriate    Co-evaluation              AM-PAC PT "6 Clicks" Mobility   Outcome Measure  Help needed turning from your back to your side while in a flat bed without using bedrails?: None Help needed moving from lying on your back to sitting on the side of a flat bed without using bedrails?: None Help needed moving to and from a bed to a chair (including a wheelchair)?: None Help needed standing up from a chair using your arms (e.g., wheelchair or bedside chair)?: A Little Help needed to walk in hospital room?: A Little Help needed climbing 3-5 steps with a railing? : A Little 6 Click Score: 21    End of Session Equipment Utilized During Treatment: Gait belt Activity Tolerance: Patient tolerated treatment well Patient left: in bed;with call bell/phone within reach;with bed alarm set Nurse Communication: Mobility status PT Visit Diagnosis: Other abnormalities of gait and mobility (R26.89);Muscle weakness (generalized) (M62.81);Difficulty in walking, not elsewhere classified (R26.2);Pain Pain - Right/Left: Left Pain - part of body: Hip     Time: 1333-1400 PT Time Calculation (min) (ACUTE ONLY): 27 min  Charges:  $Gait Training: 8-22 mins  $Therapeutic Activity: 8-22 mins                     Minna Merritts, PT, MPT    Percell Locus 02/13/2021, 2:32 PM

## 2021-02-13 NOTE — Plan of Care (Signed)
Problem: Education: Goal: Knowledge of General Education information will improve Description: Including pain rating scale, medication(s)/side effects and non-pharmacologic comfort measures 02/13/2021 1737 by Jules Schick, RN Outcome: Adequate for Discharge 02/13/2021 1110 by Jules Schick, RN Outcome: Progressing   Problem: Health Behavior/Discharge Planning: Goal: Ability to manage health-related needs will improve 02/13/2021 1737 by Jules Schick, RN Outcome: Adequate for Discharge 02/13/2021 1110 by Jules Schick, RN Outcome: Progressing   Problem: Clinical Measurements: Goal: Ability to maintain clinical measurements within normal limits will improve 02/13/2021 1737 by Jules Schick, RN Outcome: Adequate for Discharge 02/13/2021 1110 by Jules Schick, RN Outcome: Progressing Goal: Will remain free from infection 02/13/2021 1737 by Jules Schick, RN Outcome: Adequate for Discharge 02/13/2021 1110 by Jules Schick, RN Outcome: Progressing Goal: Diagnostic test results will improve 02/13/2021 1737 by Jules Schick, RN Outcome: Adequate for Discharge 02/13/2021 1110 by Jules Schick, RN Outcome: Progressing Goal: Respiratory complications will improve 02/13/2021 1737 by Jules Schick, RN Outcome: Adequate for Discharge 02/13/2021 1110 by Jules Schick, RN Outcome: Progressing Goal: Cardiovascular complication will be avoided 02/13/2021 1737 by Jules Schick, RN Outcome: Adequate for Discharge 02/13/2021 1110 by Jules Schick, RN Outcome: Progressing   Problem: Activity: Goal: Risk for activity intolerance will decrease 02/13/2021 1737 by Jules Schick, RN Outcome: Adequate for Discharge 02/13/2021 1110 by Jules Schick, RN Outcome: Progressing   Problem: Nutrition: Goal: Adequate nutrition will be maintained 02/13/2021 1737 by Jules Schick, RN Outcome: Adequate for Discharge 02/13/2021 1110 by Jules Schick, RN Outcome:  Progressing   Problem: Coping: Goal: Level of anxiety will decrease 02/13/2021 1737 by Jules Schick, RN Outcome: Adequate for Discharge 02/13/2021 1110 by Jules Schick, RN Outcome: Progressing   Problem: Elimination: Goal: Will not experience complications related to bowel motility 02/13/2021 1737 by Jules Schick, RN Outcome: Adequate for Discharge 02/13/2021 1110 by Jules Schick, RN Outcome: Progressing Goal: Will not experience complications related to urinary retention 02/13/2021 1737 by Jules Schick, RN Outcome: Adequate for Discharge 02/13/2021 1110 by Jules Schick, RN Outcome: Progressing   Problem: Pain Managment: Goal: General experience of comfort will improve 02/13/2021 1737 by Jules Schick, RN Outcome: Adequate for Discharge 02/13/2021 1110 by Jules Schick, RN Outcome: Progressing   Problem: Safety: Goal: Ability to remain free from injury will improve 02/13/2021 1737 by Jules Schick, RN Outcome: Adequate for Discharge 02/13/2021 1110 by Jules Schick, RN Outcome: Progressing   Problem: Education: Goal: Knowledge of the prescribed therapeutic regimen will improve 02/13/2021 1737 by Jules Schick, RN Outcome: Adequate for Discharge 02/13/2021 1110 by Jules Schick, RN Outcome: Progressing Goal: Understanding of discharge needs will improve 02/13/2021 1737 by Jules Schick, RN Outcome: Adequate for Discharge 02/13/2021 1110 by Jules Schick, RN Outcome: Progressing Goal: Individualized Educational Video(s) 02/13/2021 1737 by Jules Schick, RN Outcome: Adequate for Discharge 02/13/2021 1110 by Jules Schick, RN Outcome: Progressing   Problem: Activity: Goal: Ability to avoid complications of mobility impairment will improve 02/13/2021 1737 by Jules Schick, RN Outcome: Adequate for Discharge 02/13/2021 1110 by Jules Schick, RN Outcome: Progressing Goal: Ability to tolerate increased activity will  improve 02/13/2021 1737 by Jules Schick, RN Outcome: Adequate for Discharge 02/13/2021 1110 by Jules Schick, RN Outcome: Progressing   Problem: Clinical Measurements: Goal: Postoperative complications will be avoided or minimized 02/13/2021 1737 by Jules Schick, RN Outcome: Adequate for Discharge 02/13/2021 1110 by Jules Schick, RN Outcome: Progressing   Problem: Pain Management: Goal: Pain level will decrease with appropriate interventions 02/13/2021 1737 by Jules Schick, RN Outcome: Adequate for Discharge 02/13/2021 1110 by Dashanna Kinnamon,  Primitivo Gauze, RN Outcome: Progressing

## 2021-02-13 NOTE — Progress Notes (Signed)
Physical Therapy Treatment Patient Details Name: Susan Ayala MRN: [id number] DOB: 09/12/1934 Today's Date: 02/13/2021    History of Present Illness Pt is an 87 yo female s/p L THA, WBAT posterior approach. PMH of HTN, anxiety, depression, osteoporosis.    PT Comments    Pt was sitting in recliner upon arriving with supportive daughter at bedside. She agrees to PT session and is cooperative and pleasant throughout. Knew 2/3 hip precautions however able to adhere to throughout. She stood and ambulated with RW without LOB or difficulty. Performed ascending/descending stairs without difficulty or safety concerns. issued HEP and pt demonstrated ability to perform I'ly. She was repositioned in recliner post session with call bell in reach and RN in room. Cleared form a PT standpoint for safe DC home with HHPT to follow.     Follow Up Recommendations  Home health PT;Supervision - Intermittent     Equipment Recommendations  None recommended by PT       Precautions / Restrictions Precautions Precautions: Fall;Posterior Hip Precaution Booklet Issued: Yes (comment) Restrictions Weight Bearing Restrictions: Yes LLE Weight Bearing: Weight bearing as tolerated    Mobility  Bed Mobility      General bed mobility comments: in recliner pre/post session    Transfers Overall transfer level: Modified independent Equipment used: Rolling walker (2 wheeled) Transfers: Sit to/from Stand Sit to Stand: Modified independent (Device/Increase time)         General transfer comment: no physical assistance or cues required.  Ambulation/Gait Ambulation/Gait assistance: Supervision Gait Distance (Feet): 150 Feet Assistive device: Rolling walker (2 wheeled) Gait Pattern/deviations: Antalgic;Step-through pattern Gait velocity: WNL   General Gait Details: no unsteadiness noted   Stairs Stairs: Yes Stairs assistance: Supervision Stair Management: Two rails;Step to pattern Number of Stairs:  4 General stair comments: Pt was easily able to ascend/descend 4 stair without physical assistance or cues. pt rremember proper sequencing from previous hip replacement     Balance Overall balance assessment: Modified Independent  Standing balance comment: no LOB with static standing even without UE support         Cognition Arousal/Alertness: Awake/alert Behavior During Therapy: WFL for tasks assessed/performed Overall Cognitive Status: Within Functional Limits for tasks assessed        General Comments: Pt is A and O x 4 and extremely pleasant and cooperative. knew 2/3 hip      Exercises Total Joint Exercises Ankle Circles/Pumps: AROM;Strengthening;Both;10 reps Quad Sets: AROM;Strengthening;Both;10 reps Gluteal Sets: AROM;Strengthening;Both;10 reps Hip ABduction/ADduction: AROM;10 reps        Pertinent Vitals/Pain Pain Assessment: No/denies pain    Home Living Family/patient expects to be discharged to:: Private residence Living Arrangements: Alone Available Help at Discharge: Family;Other (Comment) (daughter retired Therapist, sports will stay post d/c) Type of Home: House Home Access: Stairs to enter Entrance Stairs-Rails: Right Home Layout: [place]: Toilet riser;Walker - 2 wheels;Walker - 4 wheels;Bedside commode;Grab bars - tub/shower;Grab bars - toilet      Prior Function Level of Independence: Independent          PT Goals (current goals can now be found in the care plan section) Progress towards PT goals: Progressing toward goals    Frequency    BID      PT Plan Current plan remains appropriate       AM-PAC PT "6 Clicks" Mobility   Outcome Measure  Help needed turning from your back to your side while in a flat bed without using bedrails?: None Help needed moving from lying  on your back to sitting on the side of a flat bed without using bedrails?: None Help needed moving to and from a bed to a chair (including a wheelchair)?: None Help  needed standing up from a chair using your arms (e.g., wheelchair or bedside chair)?: A Little Help needed to walk in hospital room?: A Little Help needed climbing 3-5 steps with a railing? : A Little 6 Click Score: 21    End of Session Equipment Utilized During Treatment: Gait belt Activity Tolerance: Patient tolerated treatment well Patient left: in chair;with call bell/phone within reach;with chair alarm set Nurse Communication: Mobility status PT Visit Diagnosis: Other abnormalities of gait and mobility (R26.89);Muscle weakness (generalized) (M62.81);Difficulty in walking, not elsewhere classified (R26.2);Pain Pain - Right/Left: Left Pain - part of body: Hip     Time: 1010-1034 PT Time Calculation (min) (ACUTE ONLY): 24 min  Charges:  $Gait Training: 8-22 mins $Therapeutic Exercise: 8-22 mins                     Julaine Fusi PTA 02/13/21, 10:43 AM

## 2021-02-13 NOTE — Progress Notes (Signed)
ORTHOPAEDICS PROGRESS NOTE  PATIENT NAME: Susan Ayala DOB: 1934/09/30  MRN: [id number]  POD # 1: Left total hip arthroplasty  Subjective: The patient rested well last night. She had one episode of nausea and vomiting, but denies any nausea this morning. She was extremely pleased by her progress with physical therapy yesterday.  Objective: Vital signs in last 24 hours: Temp:  [97.5 F (36.4 C)-98.5 F (36.9 C)] 97.9 F (36.6 C) (03/01 0359) Pulse Rate:  [84-99] 87 (03/01 0359) Resp:  [13-25] 18 (03/01 0359) BP: (104-139)/(60-95) 122/69 (03/01 0359) SpO2:  [91 %-100 %] 94 % (03/01 0359)  Intake/Output from previous day: 02/28 0701 - 03/01 0700 In: 1536.9 [P.O.:120; I.V.:816.9; IV Piggyback:600] Out: 1210 [Urine:325; Emesis/NG output:725; Drains:60; Blood:100]  No results for input(s): WBC, HGB, HCT, PLT, K, CL, CO2, BUN, CREATININE, GLUCOSE, CALCIUM, LABPT, INR in the last 72 hours.  EXAM General: Well-developed well-nourished female seen in no apparent discomfort. Lungs: clear to auscultation Cardiac: normal rate and regular rhythm Abdomen: Soft, nontender, nondistended.  Quiet bowel sounds are present. Left lower extremity: Hip dressing is dry and intact.  No significant ecchymosis or swelling.  Hemovac drain is intact and functioning.  Homans test is negative. Neurologic: Awake, alert, and oriented.  Sensory and motor function are intact.  Assessment: Left total hip arthroplasty  Secondary diagnoses: Osteoporosis Hypertension Hypercholesterolemia Gastroesophageal reflux disease Depression History of anemia  Plan: Notes from physical therapy were reviewed. Today's goals were reviewed with the patient.  Continue with physical therapy and Occupational Therapy as per total hip arthroplasty rehab protocol. Posterior hip precautions are to be followed.  The patient may be weightbearing as tolerated to the left lower extremity. Plan is to go Home after hospital  stay. DVT Prophylaxis - Lovenox, Foot Pumps and TED hose  James P. Holley Bouche M.D.

## 2021-02-13 NOTE — Evaluation (Signed)
Occupational Therapy Evaluation Patient Details Name: Susan Ayala MRN: [id number] DOB: 02-07-1934 Today's Date: 02/13/2021    History of Present Illness Pt is an 87 yo female s/p L THA, WBAT posterior approach. PMH of HTN, anxiety, depression, osteoporosis.   Clinical Impression   Susan Ayala was seen for OT evaluation this date, POD#1 from above surgery. Pt was independent in all ADLs prior to surgery, however occasionally using 4WW for mobility due to L hip pain. Pt is eager to return to PLOF with less pain and improved safety and independence. Pt currently requires MOD A for LB dressing while in seated position due to pain and limited AROM of L hip. Pt completed x2 toilet transfers and perihygiene in standing with SBA + RW. Pt able to recall 2/3 posterior total hip precautions at start of session and unable to verbalize how to implement during ADL and mobility. Pt instructed in posterior total hip precautions and how to implement, self care skills, falls prevention strategies, home/routines modifications, DME/AE for LB bathing and dressing tasks, and compression stocking mgt strategies. At end of session, pt able to recall 3/3 posterior total hip precautions. Pt would benefit from additional instruction in self care skills and techniques to help maintain precautions with or without assistive devices to support recall and carryover prior to discharge. Recommend HHOT upon discharge.      Follow Up Recommendations  Home health OT    Equipment Recommendations  None recommended by OT    Recommendations for Other Services       Precautions / Restrictions Precautions Precautions: Fall;Posterior Hip Precaution Booklet Issued: Yes (comment) Restrictions Weight Bearing Restrictions: Yes LLE Weight Bearing: Weight bearing as tolerated      Mobility Bed Mobility Overal bed mobility: Needs Assistance Bed Mobility: Supine to Sit;Sit to Supine     Supine to sit: Min guard Sit to supine: Min  assist   General bed mobility comments: assistance for LLE support to return to bed. verbal cues for sequencing and technique.    Transfers Overall transfer level: Needs assistance Equipment used: Rolling walker (2 wheeled) Transfers: Sit to/from Stand Sit to Stand: Min guard         General transfer comment: B rails from [facility] oer toilet    Balance Overall balance assessment: Needs assistance Sitting-balance support: Feet supported Sitting balance-Leahy Scale: Good     Standing balance support: No upper extremity supported;During functional activity Standing balance-Leahy Scale: Good Standing balance comment: no LOB with static standing even without UE support                           ADL either performed or assessed with clinical judgement   ADL Overall ADL's : Needs assistance/impaired                                       General ADL Comments: SBA + RW bathing UB and perineal area standing sinkside. SBA + RW for toilet t/f x2 and perihygiene in standing. MOD A for LBD - VCs for functional application of posterior hip pcns.                  Pertinent Vitals/Pain Pain Assessment: Faces Faces Pain Scale: Hurts a little bit Pain Location: left hip Pain Descriptors / Indicators: Sore Pain Intervention(s): Ice applied (ice pack applied to left hip after mobilizing)  Hand Dominance Right   Extremity/Trunk Assessment Upper Extremity Assessment Upper Extremity Assessment: Overall WFL for tasks assessed   Lower Extremity Assessment Lower Extremity Assessment: Overall WFL for tasks assessed   Cervical / Trunk Assessment Cervical / Trunk Assessment: Normal   Communication Communication Communication: No difficulties   Cognition Arousal/Alertness: Awake/alert Behavior During Therapy: WFL for tasks assessed/performed Overall Cognitive Status: Within Functional Limits for tasks assessed                                      General Comments       Exercises Exercises: Other exercises Other Exercises Other Exercises: Pt and fmaily educated re: OT role, DME recs, d/c recs, falls prevention, ECS, adapted dressing techniques, funcitonal application of posterior hip pcns Other Exercises: LBD, bathing, toileting, grooming, sup>sit, sitting/standing balance/tolerance   Shoulder Instructions      Home Living Family/patient expects to be discharged to:: Private residence Living Arrangements: Alone Available Help at Discharge: Family;Other (Comment) (daughter retired Therapist, sports will stay post d/c) Type of Home: House Home Access: Stairs to enter [employer] of Steps: 1 +1 Entrance Stairs-Rails: Right Home Layout: Multi-level Alternate Level Stairs-Number of Steps: has a chair lift for 5 steos to bedroom level, another lift for 9 steps to kitchen/laundry level Alternate Level Stairs-Rails: Can reach both Bathroom Shower/Tub: Occupational psychologist: [place]: Toilet riser;Walker - 2 wheels;Walker - 4 wheels;Bedside commode;Grab bars - tub/shower;Grab bars - toilet          Prior Functioning/Environment Level of Independence: Independent                 OT Problem List: Decreased range of motion;Decreased activity tolerance;Impaired balance (sitting and/or standing)      OT Treatment/Interventions: Self-care/ADL training;Therapeutic exercise;Energy conservation;DME and/or AE instruction;Therapeutic activities;Balance training;Patient/family education    OT Goals(Current goals can be found in the care plan section) Acute Rehab OT Goals Patient Stated Goal: to go home OT Goal Formulation: With patient/family Time For Goal Achievement: 02/27/21 Potential to Achieve Goals: Good  OT Frequency: Min 1X/week    AM-PAC OT "6 Clicks" Daily Activity     Outcome Measure Help from another person eating meals?: None Help from another person taking care of personal  grooming?: None Help from another person toileting, which includes using toliet, bedpan, or urinal?: A Little Help from another person bathing (including washing, rinsing, drying)?: A Little Help from another person to put on and taking off regular upper body clothing?: None Help from another person to put on and taking off regular lower body clothing?: A Lot 6 Click Score: 20   End of Session Equipment Utilized During Treatment: Rolling walker  Activity Tolerance: Patient tolerated treatment well Patient left: in chair;with call bell/phone within reach;with family/visitor present  OT Visit Diagnosis: Other abnormalities of gait and mobility (R26.89);Muscle weakness (generalized) (M62.81)                Time: 1779-3903 OT Time Calculation (min): 51 min Charges:  OT General Charges $OT Visit: 1 Visit OT Evaluation $OT Eval Low Complexity: 1 Low OT Treatments $Self Care/Home Management : 38-52 mins  Dessie Coma, M.S. OTR/L  02/13/21, 3:02 PM  ascom [phone]

## 2021-02-14 LAB — SURGICAL PATHOLOGY

## 2021-09-05 ENCOUNTER — Encounter: Payer: Self-pay | Admitting: Internal Medicine

## 2021-09-05 ENCOUNTER — Other Ambulatory Visit
Admission: RE | Admit: 2021-09-05 | Discharge: 2021-09-05 | Disposition: A | Discharge status: Home / Self Care | Payer: Medicare Other | Source: Ambulatory Visit | Attending: Internal Medicine | Admitting: Internal Medicine

## 2021-09-05 ENCOUNTER — Other Ambulatory Visit: Payer: Self-pay

## 2021-09-05 ENCOUNTER — Ambulatory Visit (INDEPENDENT_AMBULATORY_CARE_PROVIDER_SITE_OTHER): Payer: Medicare Other | Admitting: Internal Medicine

## 2021-09-05 VITALS — BP 174/100 | HR 79 | Ht 65.0 in | Wt 160.0 lb

## 2021-09-05 DIAGNOSIS — I10 Essential (primary) hypertension: Secondary | ICD-10-CM

## 2021-09-05 MED ORDER — OLMESARTAN MEDOXOMIL 40 MG PO TABS: 40.0000 mg | ORAL_TABLET | Freq: Every day | ORAL | 1 refills | Status: DC

## 2021-09-05 MED ORDER — HYDROCHLOROTHIAZIDE 25 MG PO TABS: 25.0000 mg | ORAL_TABLET | Freq: Every day | ORAL | 1 refills | Status: DC

## 2021-09-05 NOTE — Patient Instructions (Signed)
Medication Instructions:   Your physician has recommended you make the following change in your medication:   INCREASE Hydrochlorothiazide (HCTZ) 25 mg daily  DECREASE Olmesartan 20 mg daily   *If you need a refill on your cardiac medications before your next appointment, please call your pharmacy*   Lab Work:  Your physician recommends that you have lab work TODAY at the [facility]: Serum Aldosterone & Plasma Renin activity ratio  -  Please go to the [place] will check in at the front desk to the right as you walk into the atrium.    If you have labs (blood work) drawn today and your tests are completely normal, you will receive your results only by: [place] (if you have MyChart) OR A paper copy in the mail If you have any lab test that is abnormal or we need to change your treatment, we will call you to review the results.   Testing/Procedures:  Your physician has requested that you have a renal artery duplex. During this test, an ultrasound is used to evaluate blood flow to the kidneys. Allow one hour for this exam. Do not eat after midnight the day before and avoid carbonated beverages. Take your medications as you usually do.   Follow-Up: At [facility], you and your health needs are our priority.  As part of our continuing mission to provide you with exceptional heart care, we have created designated Provider Care Teams.  These Care Teams include your primary Cardiologist (physician) and Advanced Practice Providers (APPs -  Physician Assistants and Nurse Practitioners) who all work together to provide you with the care you need, when you need it.  We recommend signing up for the patient portal called "MyChart".  Sign up information is provided on this After Visit Summary.  MyChart is used to connect with patients for Virtual Visits (Telemedicine).  Patients are able to view lab/test results, encounter notes, upcoming appointments, etc.   Non-urgent messages can be sent to your provider as well.   To learn more about what you can do with MyChart, go to [email or web address].    Your next appointment:   1 month(s)  The format for your next appointment:   In Person  Provider:   You may see Dr. Harrell Gave End or one of the following Advanced Practice Providers on your designated Care Team:   Murray Hodgkins, NP Christell Faith, PA-C Marrianne Mood, PA-C Cadence Kathlen Mody, [place]   Other Instructions  DASH Eating Plan DASH stands for Dietary Approaches to Stop Hypertension. The DASH eating plan is a healthy eating plan that has been shown to: Reduce high blood pressure (hypertension). Reduce your risk for type 2 diabetes, heart disease, and stroke. Help with weight loss. What are tips for following this plan? Reading food labels Check food labels for the amount of salt (sodium) per serving. Choose foods with less than 5 percent of the Daily Value of sodium. Generally, foods with less than 300 milligrams (mg) of sodium per serving fit into this eating plan. To find whole grains, look for the word "whole" as the first word in the ingredient list. Shopping Buy products labeled as "low-sodium" or "no salt added." Buy fresh foods. Avoid canned foods and pre-made or frozen meals. Cooking Avoid adding salt when cooking. Use salt-free seasonings or herbs instead of table salt or sea salt. Check with your health care provider or pharmacist before using salt substitutes. Do not fry foods. Cook foods  using healthy methods such as baking, boiling, grilling, roasting, and broiling instead. Cook with heart-healthy oils, such as olive, canola, avocado, soybean, or sunflower oil. Meal planning  Eat a balanced diet that includes: 4 or more servings of fruits and 4 or more servings of vegetables each day. Try to fill one-half of your plate with fruits and vegetables. 6-8 servings of whole grains each day. Less than 6 oz (170 g) of lean  meat, poultry, or fish each day. A 3-oz (85-g) serving of meat is about the same size as a deck of cards. One egg equals 1 oz (28 g). 2-3 servings of low-fat dairy each day. One serving is 1 cup (237 mL). 1 serving of nuts, seeds, or beans 5 times each week. 2-3 servings of heart-healthy fats. Healthy fats called omega-3 fatty acids are found in foods such as walnuts, flaxseeds, fortified milks, and eggs. These fats are also found in cold-water fish, such as sardines, salmon, and mackerel. Limit how much you eat of: Canned or prepackaged foods. Food that is high in trans fat, such as some fried foods. Food that is high in saturated fat, such as fatty meat. Desserts and other sweets, sugary drinks, and other foods with added sugar. Full-fat dairy products. Do not salt foods before eating. Do not eat more than 4 egg yolks a week. Try to eat at least 2 vegetarian meals a week. Eat more home-cooked food and less restaurant, buffet, and fast food. Lifestyle When eating at a restaurant, ask that your food be prepared with less salt or no salt, if possible. If you drink alcohol: Limit how much you use to: 0-1 drink a day for women who are not pregnant. 0-2 drinks a day for men. Be aware of how much alcohol is in your drink. In the [place] one drink equals one 12 oz bottle of beer (355 mL), one 5 oz glass of wine (148 mL), or one 1 oz glass of hard liquor (44 mL). General information Avoid eating more than 2,300 mg of salt a day. If you have hypertension, you may need to reduce your sodium intake to 1,500 mg a day. Work with your health care provider to maintain a healthy body weight or to lose weight. Ask what an ideal weight is for you. Get at least 30 minutes of exercise that causes your heart to beat faster (aerobic exercise) most days of the week. Activities may include walking, swimming, or biking. Work with your health care provider or dietitian to adjust your eating plan to your individual  calorie needs. What foods should I eat? Fruits All fresh, dried, or frozen fruit. Canned fruit in natural juice (without added sugar). Vegetables Fresh or frozen vegetables (raw, steamed, roasted, or grilled). Low-sodium or reduced-sodium tomato and vegetable juice. Low-sodium or reduced-sodium tomato sauce and tomato paste. Low-sodium or reduced-sodium canned vegetables. Grains Whole-grain or whole-wheat bread. Whole-grain or whole-wheat pasta. Brown rice. [place]. Bulgur. Whole-grain and low-sodium cereals. Pita bread. Low-fat, low-sodium crackers. Whole-wheat flour tortillas. Meats and other proteins Skinless chicken or [place]. Ground chicken or [place]. Pork with fat trimmed off. Fish and seafood. Egg whites. Dried beans, peas, or lentils. Unsalted nuts, nut butters, and seeds. Unsalted canned beans. Lean cuts of beef with fat trimmed off. Low-sodium, lean precooked or cured meat, such as sausages or meat loaves. Dairy Low-fat (1%) or fat-free (skim) milk. Reduced-fat, low-fat, or fat-free cheeses. Nonfat, low-sodium ricotta or cottage cheese. Low-fat or nonfat yogurt. Low-fat, low-sodium cheese. Fats and oils  Soft margarine without trans fats. Vegetable oil. Reduced-fat, low-fat, or light mayonnaise and salad dressings (reduced-sodium). Canola, safflower, olive, avocado, soybean, and sunflower oils. Avocado. Seasonings and condiments Herbs. Spices. Seasoning mixes without salt. Other foods Unsalted popcorn and pretzels. Fat-free sweets. The items listed above may not be a complete list of foods and beverages you can eat. Contact a dietitian for more information. What foods should I avoid? Fruits Canned fruit in a light or heavy syrup. Fried fruit. Fruit in cream or butter sauce. Vegetables Creamed or fried vegetables. Vegetables in a cheese sauce. Regular canned vegetables (not low-sodium or reduced-sodium). Regular canned tomato sauce and paste (not low-sodium or reduced-sodium).  Regular tomato and vegetable juice (not low-sodium or reduced-sodium). Angie Fava. Olives. Grains Baked goods made with fat, such as croissants, muffins, or some breads. Dry pasta or rice meal packs. Meats and other proteins Fatty cuts of meat. Ribs. Fried meat. Berniece Salines. Bologna, salami, and other precooked or cured meats, such as sausages or meat loaves. Fat from the back of a pig (fatback). Bratwurst. Salted nuts and seeds. Canned beans with added salt. Canned or smoked fish. Whole eggs or egg yolks. Chicken or [place] with skin. Dairy Whole or 2% milk, cream, and half-and-half. Whole or full-fat cream cheese. Whole-fat or sweetened yogurt. Full-fat cheese. Nondairy creamers. Whipped toppings. Processed cheese and cheese spreads. Fats and oils Butter. Stick margarine. Lard. Shortening. Ghee. Bacon fat. Tropical oils, such as coconut, palm kernel, or palm oil. Seasonings and condiments Onion salt, garlic salt, seasoned salt, table salt, and sea salt. Worcestershire sauce. Tartar sauce. Barbecue sauce. Teriyaki sauce. Soy sauce, including reduced-sodium. Steak sauce. Canned and packaged gravies. Fish sauce. Oyster sauce. Cocktail sauce. Store-bought horseradish. Ketchup. Mustard. Meat flavorings and tenderizers. Bouillon cubes. Hot sauces. Pre-made or packaged marinades. Pre-made or packaged taco seasonings. Relishes. Regular salad dressings. Other foods Salted popcorn and pretzels. The items listed above may not be a complete list of foods and beverages you should avoid. Contact a dietitian for more information. Where to find more information National Heart, Lung, and Blood Institute: https://wilson-eaton.com/ American Heart Association: www.heart.org Academy of Nutrition and Dietetics: www.eatright.[place]: www.kidney.org Summary The DASH eating plan is a healthy eating plan that has been shown to reduce high blood pressure (hypertension). It may also reduce your risk for type 2  diabetes, heart disease, and stroke. When on the DASH eating plan, aim to eat more fresh fruits and vegetables, whole grains, lean proteins, low-fat dairy, and heart-healthy fats. With the DASH eating plan, you should limit salt (sodium) intake to 2,300 mg a day. If you have hypertension, you may need to reduce your sodium intake to 1,500 mg a day. Work with your health care provider or dietitian to adjust your eating plan to your individual calorie needs. This information is not intended to replace advice given to you by your health care provider. Make sure you discuss any questions you have with your health care provider. Document Revised: 11/05/2019 Document Reviewed: 11/05/2019 Elsevier Patient Education  2022 [employer].

## 2021-09-05 NOTE — Progress Notes (Signed)
New Outpatient Visit Date: 09/05/2021  Primary Care Provider: Ezequiel Kayser, MD (Inactive) [place] [address]  Chief Complaint: Elevated blood pressure  HPI:  Ms. Paletta is a 87 y.o. female who is being seen today as a self referral for management of hypertension. She has a history of hypertension, hyperlipidemia, depression, and osteoarthritis.  She presents today for management of high blood pressure.  She previously saw Dr. Dorthula Perfect at [facility], though he left the practice a few months ago.  Since then, her blood pressure has been difficult to control despite multiple changes to her blood pressure medications.  She was initially started on the lisinopril with de-escalation of HCTZ.  However, this was switched to olmesartan because of significant leg cramps.  She has continued to have intermittent leg cramps and poorly controlled blood pressure.  Few days ago, she was also prescribed amlodipine.  She has taken 3 doses and has also noticed some leg cramps.  She wonders if amlodipine may be contributing to this as well.  Her blood pressures at home are typically greater than 160/90 with some readings up to the 200s over 100s.  She notices an intermittent "foggy" feeling in her brain.  She wonders if some of this is due to not sleeping well on account of the aforementioned leg cramps at night.  She does not have leg pain with ambulation.  She also denies chest pain, shortness of breath, palpitations, lightheadedness, and edema.  --------------------------------------------------------------------------------------------------  Cardiovascular History & Procedures: Cardiovascular Problems: Labile hypertension  Risk Factors: Hypertension, hyperlipidemia, and age greater than 73  Cath/PCI: None  CV Surgery: None  EP Procedures and Devices: None  Non-Invasive Evaluation(s): None  Recent CV Pertinent Labs: Lab Results  Component Value Date    INR 1.0 02/01/2021   INR 0.9 02/02/2014   K 3.5 02/01/2021   K 3.0 (L) 09/22/2014   BUN 18 02/01/2021   BUN 13 09/22/2014   CREATININE 0.61 02/01/2021   CREATININE 0.71 09/22/2014    --------------------------------------------------------------------------------------------------  Past Medical History:  Diagnosis Date   Anemia    H/O   Arthritis    Depression    GERD (gastroesophageal reflux disease)    OCC   Hypercholesterolemia    Hypertension    Osteoporosis    Pneumonia    AGE 13   PONV (postoperative nausea and vomiting)    SEVERE   Syncope 2002   DUE TO DEHYDRATION-NEGATIVE W/U BY DR Ubaldo Glassing    Past Surgical History:  Procedure Laterality Date   ABDOMINAL HYSTERECTOMY     BREAST SURGERY Bilateral 1993   biopsy negative   CATARACT EXTRACTION, BILATERAL Bilateral 2013   eyes done 30 days apart   KNEE ARTHROSCOPY Right 08/26/2016   Procedure: ARTHROSCOPY KNEE;  Surgeon: Dereck Leep, MD;  Location: [facility] ORS;  Service: Orthopedics;  Laterality: Right;  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   amLODipine (NORVASC) 5 MG tablet Take 5 mg by mouth daily.   amoxicillin (AMOXIL) 500 MG  capsule Take 2,000 mg by mouth as needed. PRIOR TO DENTAL PROCEDURES   Calcium Carb-Cholecalciferol 600-800 MG-UNIT TABS Take 1 tablet by mouth daily.   calcium carbonate (TUMS - DOSED IN MG ELEMENTAL CALCIUM) 500 MG chewable tablet Chew 1 tablet by mouth as needed for indigestion or heartburn.   Cholecalciferol (VITAMIN D3) 1000 units CAPS Take 1,000  Units by mouth daily.   diclofenac Sodium (VOLTAREN) 1 % GEL Apply 1 application topically daily as needed (pain).   hydrochlorothiazide (HYDRODIURIL) 25 MG tablet Take 12.5 mg by mouth daily.   lovastatin (MEVACOR) 40 MG tablet Take 40 mg by mouth at bedtime.   metoprolol succinate (TOPROL-XL) 50 MG 24 hr tablet Take 50 mg by mouth every evening.   Multiple Vitamins-Minerals (MULTI-VITAMIN/MINERALS PO) Take 1 tablet by mouth daily.    olmesartan (BENICAR) 40 MG tablet Take 40 mg by mouth daily.   Potassium Chloride ER 20 MEQ TBCR Take 40 mEq by mouth daily.   sertraline (ZOLOFT) 50 MG tablet Take 75 mg by mouth every morning.   [DISCONTINUED] hydrochlorothiazide (HYDRODIURIL) 25 MG tablet Take 25 mg by mouth every morning.    Allergies: Codeine, Adhesive [tape], and Zolpidem  Social History   Tobacco Use   Smoking status: Never   Smokeless tobacco: Never  Vaping Use   Vaping Use: Never used  Substance Use Topics   Alcohol use: Not Currently    Comment: No drink in 3 year   Drug use: No    Family History  Problem Relation Age of Onset   Hypertension Mother    Colon cancer Father     Review of Systems: A 12-system review of systems was performed and was negative except as noted in the HPI.  --------------------------------------------------------------------------------------------------  Physical Exam: BP (!) 174/100 (BP Location: Right Arm, Patient Position: Sitting, Cuff Size: Normal)   Pulse 79   Ht 5\' 5"  (1.651 m)   Wt 160 lb (72.6 kg)   SpO2 97%   BMI 26.63 kg/m   General: NAD. HEENT: No conjunctival pallor or scleral icterus. Facemask in place. Neck: Supple without lymphadenopathy, thyromegaly, JVD, or HJR. No carotid bruit. Lungs: Normal work of breathing. Clear to auscultation bilaterally without wheezes or crackles. Heart: Regular rate and rhythm without murmurs, rubs, or gallops. Non-displaced PMI. Abd: Bowel sounds present. Soft, NT/ND without  hepatosplenomegaly Ext: No lower extremity edema. Radial, PT, and DP pulses are 2+ bilaterally Skin: Warm and dry without rash. Neuro: CNIII-XII intact. Strength and fine-touch sensation intact in upper and lower extremities bilaterally. Psych: Normal mood and affect.  EKG: Normal sinus rhythm without abnormality.  Lab Results  Component Value Date   WBC 7.7 02/01/2021   HGB 11.3 (L) 02/01/2021   HCT 35.3 (L) 02/01/2021   MCV 88.9 02/01/2021   PLT 291 02/01/2021    Lab Results  Component Value Date   NA 134 (L) 02/01/2021   K 3.5 02/01/2021   CL 96 (L) 02/01/2021   CO2 27 02/01/2021   BUN 18 02/01/2021   CREATININE 0.61 02/01/2021   GLUCOSE 94 02/01/2021   ALT 14 02/01/2021   Outside labs (08/23/2021):  --------------------------------------------------------------------------------------------------  ASSESSMENT AND PLAN: Uncontrolled hypertension: Ms. Whitworth is a long history of hypertension, though it has been quite elevated over the last few months despite extensive medication titration.  This has been complicated by frequent nocturnal leg cramps that Ms. Jablonski initially attributed to lisinopril though the cramps have persisted on the olmesartan and more recent addition of amlodipine.  We have agreed to cut olmesartan in half to 20 mg daily and increase HCTZ to 25 mg daily.  I have recommended that we initiate a work-up for secondary hypertension given resistant hypertension and fairly sudden worsening at an advanced age.  We have agreed to check a serum aldosterone to plasma renin activity ratio as well as a renal artery Doppler ultrasound.  Information about the DASH diet has been provided.  I will have Ms. Julien Girt return in 1 month for blood pressure check and repeat BMP.  Follow-up: Return to clinic in 1 month with BMP at that time.  [name], MD 09/05/2021 11:18 AM

## 2021-09-07 ENCOUNTER — Encounter: Payer: Self-pay | Admitting: Internal Medicine

## 2021-09-09 LAB — ALDOSTERONE + RENIN ACTIVITY W/ RATIO
ALDO / PRA Ratio: 40.5 — ABNORMAL HIGH (ref 0.0–30.0)
Aldosterone: 10.6 ng/dL (ref 0.0–30.0)
PRA LC/MS/MS: 0.262 ng/mL/hr (ref 0.167–5.380)

## 2021-09-19 ENCOUNTER — Telehealth: Payer: Self-pay | Admitting: Internal Medicine

## 2021-09-19 NOTE — Telephone Encounter (Signed)
Susan Bush, MD  Solmon Ice, RN Please let [name] know that her serum aldosterone to plasma renin activity ratio is elevated.  This suggests that hyperaldosteronism may be driving some of her hypertension.  I recommend that she continue her current medications and move forward with renal artery Doppler.  If no significant renal artery stenosis is identified on that study, it may be helpful to refer her to endocrinology for further evaluation as well as to consider addition of an aldosterone antagonist like spironolactone to help treat her blood pressure if it remains elevated.   Attempted to call pt. Unable to reach pt, uses "smart blocker" and our calls are blocked.  Mychart message sent to pt asking that she call us to discuss results note above.   Scheduling may contact nurse next time pt calls back so that we may discuss results since unable to call pt back.

## 2021-09-19 NOTE — Telephone Encounter (Signed)
Patient received MyChart message regarding results, please assist

## 2021-09-19 NOTE — Telephone Encounter (Signed)
Spoke with Susan Ayala.  Notified of lab results and Dr. Darnelle Bos recc below.  Susan Ayala voiced understanding.  She will continue her current medications and move forward with renal artery Doppler that is scheduled 10/04/21.  Susan Ayala proceeded to provide blood pressure readings from the past 2 weeks.  Office visit 9/21 - HCTZ incr to 25 mg daily.  Per Susan Ayala, all AM BP's listed are BEFORE morning medications.   9/22  AM 160/93  Noon 130/80  PM 142/82 9/23  AM 166/96  Noon 134/88  PM 137/82 9/24  AM 150/88  Noon 144/78  PM 112/72 9/25  AM 158/82  Noon 114/72  PM 109/69 9/26  AM 156/88  Noon 118/72  PM 137/79 9/27  AM 160/87  Noon 147/78  PM 138/81 9/28  AM 170/83  Noon 144/84  PM 166/94 9/29  AM 153/81  PM 147/85 9/30  AM 142/78  PM 149/86 10/1  AM 157/82  PM 146/80 10/2  AM 151/87  PM 132/76 10/3  AM 158/78  PM 127/72 10/4  AM 154/95  PM 115/77

## 2021-09-20 NOTE — Telephone Encounter (Signed)
Blood pressures remain somewhat labile.  I recommend that she continue her current medications for now.  We will be able to provide more guidance after completion of renal artery Doppler.  [name], MD [facility]

## 2021-09-21 NOTE — Telephone Encounter (Signed)
Spoke to pt and notified of Dr. Darnelle Bos recc.  Pt voiced understanding and will proceed with renal artery doppler on 10/04/21.  Pt has no further questions at this time.

## 2021-10-04 ENCOUNTER — Other Ambulatory Visit: Payer: Self-pay

## 2021-10-04 ENCOUNTER — Ambulatory Visit (INDEPENDENT_AMBULATORY_CARE_PROVIDER_SITE_OTHER): Payer: Medicare Other

## 2021-10-04 DIAGNOSIS — I10 Essential (primary) hypertension: Secondary | ICD-10-CM

## 2021-10-09 ENCOUNTER — Telehealth: Payer: Self-pay | Admitting: *Deleted

## 2021-10-09 NOTE — Telephone Encounter (Signed)
-----   Message from [name], MD sent at 10/05/2021  3:21 PM EDT ----- Please let Ms. Dredge know that her kidney artery ultrasound shows no evidence of significant narrowing/blockage to explain her elevated blood pressures.  I recommend that she continue her current medications and follow-up as previously arranged.

## 2021-10-09 NOTE — Telephone Encounter (Signed)
Left voicemail message to call back for review of results.  

## 2021-10-10 NOTE — Telephone Encounter (Signed)
Patient calling to discuss recent testing results  ° °Please call  ° °

## 2021-10-11 NOTE — Telephone Encounter (Signed)
Left voicemail message to call back for review of results.  

## 2021-10-11 NOTE — Telephone Encounter (Signed)
Reviewed results, recommendations, and confirmed upcoming appointment. She verbalized understanding with no further questions at this time.  

## 2021-10-18 ENCOUNTER — Other Ambulatory Visit: Payer: Self-pay

## 2021-10-18 ENCOUNTER — Ambulatory Visit (INDEPENDENT_AMBULATORY_CARE_PROVIDER_SITE_OTHER): Payer: Medicare Other | Admitting: Internal Medicine

## 2021-10-18 ENCOUNTER — Encounter: Payer: Self-pay | Admitting: Internal Medicine

## 2021-10-18 VITALS — BP 140/80 | HR 78 | Ht 65.0 in | Wt 161.0 lb

## 2021-10-18 DIAGNOSIS — R0989 Other specified symptoms and signs involving the circulatory and respiratory systems: Secondary | ICD-10-CM

## 2021-10-18 DIAGNOSIS — E269 Hyperaldosteronism, unspecified: Secondary | ICD-10-CM | POA: Diagnosis not present

## 2021-10-18 DIAGNOSIS — Z79899 Other long term (current) drug therapy: Secondary | ICD-10-CM

## 2021-10-18 MED ORDER — SPIRONOLACTONE 25 MG PO TABS: 12.5000 mg | ORAL_TABLET | Freq: Every day | ORAL | 1 refills | Status: DC

## 2021-10-18 NOTE — Progress Notes (Signed)
Follow-up Outpatient Visit Date: 10/18/2021  Primary Care Provider: Toni Arthurs, NP [address] [place] [address]  Chief Complaint: Follow-up hypertension and leg cramps  HPI:  Susan Ayala is a 87 y.o. female with history of hypertension, hyperlipidemia, depression, and osteoarthritis, who presents for follow-up of hypertension.  I met her in late September for evaluation of hypertension.  Blood pressure had been suboptimally controlled despite addition of several medications by her PCP.  Blood pressure was quite high at 174/100 at our visit.  I recommended increasing HCTZ to 25 mg daily and decreasing olmesartan to 20 mg daily, as Susan Ayala was concerned that olmesartan was contributing to leg cramps (she had also experienced this in the past with lisinopril).  Renal artery Doppler showed no evidence of renal artery stenosis.  Serum aldosterone to plasma renin activity ratio was elevated.  Today, Susan Ayala reports that she has been feeling fairly well.  Her legs cramps have come and gone but seem to be helped by mustard.  She is also been trying to exercise more.  Leg cramping seems to be worse when she is sedentary.  Blood pressures have improved somewhat and are now typically 130-150/70-90.  She is tolerating escalation of HCTZ and de-escalation of olmesartan well.  She has not had any chest pain or shortness of breath.  She notes occasional orthostatic lightheadedness when she stands up too quickly.  She has not fallen.  A few nights ago she had minimal swelling of the left ankle after having been sitting for quite some time playing bridge.  She otherwise denies edema.  --------------------------------------------------------------------------------------------------  Cardiovascular History & Procedures: Cardiovascular Problems: Labile hypertension with concern for hyperaldosteronism   Risk Factors: Hypertension, hyperlipidemia, and age greater than 89   Cath/PCI: None    CV Surgery: None   EP Procedures and Devices: None   Non-Invasive Evaluation(s): None  Recent CV Pertinent Labs: Lab Results  Component Value Date   INR 1.0 02/01/2021   INR 0.9 02/02/2014   K 3.5 02/01/2021   K 3.0 (L) 09/22/2014   BUN 18 02/01/2021   BUN 13 09/22/2014   CREATININE 0.61 02/01/2021   CREATININE 0.71 09/22/2014    Past medical and surgical history were reviewed and updated in EPIC.  Current Meds  Medication Sig   acetaminophen (TYLENOL) 650 MG CR tablet Take 1,300 mg by mouth in the morning and at bedtime.   amLODipine (NORVASC) 5 MG tablet Take 5 mg by mouth daily.   amoxicillin (AMOXIL) 500 MG capsule Take 2,000 mg by mouth as needed. PRIOR TO DENTAL PROCEDURES   Calcium Carb-Cholecalciferol 600-800 MG-UNIT TABS Take 1 tablet by mouth daily.   calcium carbonate (TUMS - DOSED IN MG ELEMENTAL CALCIUM) 500 MG chewable tablet Chew 1 tablet by mouth as needed for indigestion or heartburn.   Cholecalciferol (VITAMIN D3) 1000 units CAPS Take 1,000 Units by mouth daily.   diclofenac Sodium (VOLTAREN) 1 % GEL Apply 1 application topically daily as needed (pain).   hydrochlorothiazide (HYDRODIURIL) 25 MG tablet Take 1 tablet (25 mg total) by mouth daily.   lovastatin (MEVACOR) 40 MG tablet Take 40 mg by mouth at bedtime.   metoprolol succinate (TOPROL-XL) 50 MG 24 hr tablet Take 50 mg by mouth every evening.   Multiple Vitamins-Minerals (MULTI-VITAMIN/MINERALS PO) Take 1 tablet by mouth daily.    olmesartan (BENICAR) 20 MG tablet Take 20 mg by mouth daily.   Potassium Chloride ER 20 MEQ TBCR Take 40 mEq by mouth  daily.   sertraline (ZOLOFT) 50 MG tablet Take 75 mg by mouth every morning.   spironolactone (ALDACTONE) 25 MG tablet Take 0.5 tablets (12.5 mg total) by mouth daily.    Allergies: Codeine, Adhesive [tape], and Zolpidem  Social History   Tobacco Use   Smoking status: Never   Smokeless tobacco: Never  Vaping Use   Vaping Use: Never used  Substance  Use Topics   Alcohol use: Not Currently    Comment: No drink in 3 year   Drug use: No    Family History  Problem Relation Age of Onset   Hypertension Mother    Colon cancer Father     Review of Systems: A 12-system review of systems was performed and was negative except as noted in the HPI.  --------------------------------------------------------------------------------------------------  Physical Exam: BP 140/80 (BP Location: Left Arm, Patient Position: Sitting, Cuff Size: Large)   Pulse 78   Ht 5' 5"  (1.651 m)   Wt 161 lb (73 kg)   SpO2 96%   BMI 26.79 kg/m   General:  NAD. Neck: No JVD or HJR. Lungs: Clear to auscultation bilaterally without wheezes or crackles. Heart: Regular rate and rhythm without murmurs, rubs, or gallops. Abdomen: Soft, nontender, nondistended. Extremities: No lower extremity edema.   Lab Results  Component Value Date   WBC 7.7 02/01/2021   HGB 11.3 (L) 02/01/2021   HCT 35.3 (L) 02/01/2021   MCV 88.9 02/01/2021   PLT 291 02/01/2021    Lab Results  Component Value Date   NA 134 (L) 02/01/2021   K 3.5 02/01/2021   CL 96 (L) 02/01/2021   CO2 27 02/01/2021   BUN 18 02/01/2021   CREATININE 0.61 02/01/2021   GLUCOSE 94 02/01/2021   ALT 14 02/01/2021    No results found for: CHOL, HDL, LDLCALC, LDLDIRECT, TRIG, CHOLHDL  --------------------------------------------------------------------------------------------------  ASSESSMENT AND PLAN: Labile hypertension and hyperaldosteronism: Blood pressure remains mildly elevated but has improved with escalation of HCTZ and decreased dose of olmesartan at our last visit.  Her leg cramps also seem to have been a little bit better with this.  We reviewed the results of recent testing including renal artery Doppler that showed no evidence of renal artery stenosis as well as elevated serum aldosterone to plasma renin activity ratio suggestive of hyperaldosteronism.  I will refer Susan Ayala to  endocrinology for further evaluation of possible hyperaldosteronism.  In the meantime, we have agreed to start spironolactone 12.5 mg daily.  I will check a BMP today and again early next week.  We will defer changes to her other medications at this time.  Follow-up: Return to clinic in 1 month.  [name], MD 10/18/2021 12:51 PM

## 2021-10-18 NOTE — Patient Instructions (Addendum)
Medication Instructions:   Your physician has recommended you make the following change in your medication:   START Spironolactone 12.5 mg daily   *If you need a refill on your cardiac medications before your next appointment, please call your pharmacy*   Lab Work:  1) BMET today  2) Your physician recommends that you return for lab work on Monday 10/22/21 (BMET)   -  This lab is NOT fasting  If you have labs (blood work) drawn today and your tests are completely normal, you will receive your results only by: [place] (if you have [place]) OR A paper copy in the mail If you have any lab test that is abnormal or we need to change your treatment, we will call you to review the results.   Testing/Procedures:  None ordered   Follow-Up: At [facility], you and your health needs are our priority.  As part of our continuing mission to provide you with exceptional heart care, we have created designated Provider Care Teams.  These Care Teams include your primary Cardiologist (physician) and Advanced Practice Providers (APPs -  Physician Assistants and Nurse Practitioners) who all work together to provide you with the care you need, when you need it.  We recommend signing up for the patient portal called "MyChart".  Sign up information is provided on this After Visit Summary.  MyChart is used to connect with patients for Virtual Visits (Telemedicine).  Patients are able to view lab/test results, encounter notes, upcoming appointments, etc.  Non-urgent messages can be sent to your provider as well.   To learn more about what you can do with MyChart, go to [email or web address].    Your next appointment:   1 month(s)  The format for your next appointment:   In Person  Provider:   You may see Dr. Harrell Gave End or one of the following Advanced Practice Providers on your designated Care Team:   Murray Hodgkins, NP Christell Faith, PA-C Marrianne Mood, PA-C [place] Kathlen Mody,  [place]   Other Instructions  You have been referred to Dr. Mee Hives with Endocrinology at [facility].  You will receive a call to schedule this appointment.

## 2021-10-19 ENCOUNTER — Telehealth: Payer: Self-pay | Admitting: *Deleted

## 2021-10-19 LAB — BASIC METABOLIC PANEL
BUN/Creatinine Ratio: 19 (ref 12–28)
BUN: 15 mg/dL (ref 8–27)
CO2: 26 mmol/L (ref 20–29)
Calcium: 10.9 mg/dL — ABNORMAL HIGH (ref 8.7–10.3)
Chloride: 96 mmol/L (ref 96–106)
Creatinine, Ser: 0.77 mg/dL (ref 0.57–1.00)
Glucose: 101 mg/dL — ABNORMAL HIGH (ref 70–99)
Potassium: 4.5 mmol/L (ref 3.5–5.2)
Sodium: 136 mmol/L (ref 134–144)
eGFR: 76 mL/min/{1.73_m2} (ref >59)

## 2021-10-19 MED ORDER — POTASSIUM CHLORIDE ER 20 MEQ PO TBCR: 20.0000 meq | EXTENDED_RELEASE_TABLET | Freq: Every day | ORAL | Status: DC

## 2021-10-19 NOTE — Telephone Encounter (Signed)
Spoke with pt. Notified of lab results and Dr. Darnelle Bos recc.  Pt voiced understanding. Pt will follow up with PCP re elevated calcium. Results faxed to pt's PCP, Toni Arthurs, NP at [facility] [address] [facility].  Pt will decr KCL to 20 mEq daily. Med list updated.  Pt will have repeat BMET Monday 11/7 as scheduled.  Pt has no further questions at this time.

## 2021-10-19 NOTE — Telephone Encounter (Signed)
-----   Message from [name], MD sent at 10/19/2021  6:21 AM EDT ----- Please let Susan Ayala know that her kidney function and potassium are normal.  Her calcium is a little bit high, which is nonspecific, but should be followed up on by her PCP.  Given addition of spironolactone yesterday, I recommend that we reduce her potassium chloride to 20 mEq today.  She should have a repeat BMP, as discussed yesterday, this coming Monday.

## 2021-10-22 ENCOUNTER — Other Ambulatory Visit: Payer: Self-pay

## 2021-10-22 ENCOUNTER — Other Ambulatory Visit (INDEPENDENT_AMBULATORY_CARE_PROVIDER_SITE_OTHER): Payer: Medicare Other

## 2021-10-22 DIAGNOSIS — Z79899 Other long term (current) drug therapy: Secondary | ICD-10-CM

## 2021-10-22 DIAGNOSIS — R0989 Other specified symptoms and signs involving the circulatory and respiratory systems: Secondary | ICD-10-CM

## 2021-10-22 DIAGNOSIS — E269 Hyperaldosteronism, unspecified: Secondary | ICD-10-CM

## 2021-10-23 ENCOUNTER — Telehealth: Payer: Self-pay | Admitting: *Deleted

## 2021-10-23 DIAGNOSIS — I10 Essential (primary) hypertension: Secondary | ICD-10-CM

## 2021-10-23 DIAGNOSIS — Z79899 Other long term (current) drug therapy: Secondary | ICD-10-CM

## 2021-10-23 LAB — BASIC METABOLIC PANEL
BUN/Creatinine Ratio: 31 — ABNORMAL HIGH (ref 12–28)
BUN: 23 mg/dL (ref 8–27)
CO2: 24 mmol/L (ref 20–29)
Calcium: 10.1 mg/dL (ref 8.7–10.3)
Chloride: 94 mmol/L — ABNORMAL LOW (ref 96–106)
Creatinine, Ser: 0.74 mg/dL (ref 0.57–1.00)
Glucose: 88 mg/dL (ref 70–99)
Potassium: 4.7 mmol/L (ref 3.5–5.2)
Sodium: 131 mmol/L — ABNORMAL LOW (ref 134–144)
eGFR: 79 mL/min/{1.73_m2} (ref >59)

## 2021-10-23 MED ORDER — HYDROCHLOROTHIAZIDE 25 MG PO TABS: 12.5000 mg | ORAL_TABLET | Freq: Every day | ORAL | Status: DC

## 2021-10-23 NOTE — Telephone Encounter (Signed)
Spoke with pt. Notified of lab results and Dr. Darnelle Bos recc.  Pt voiced understanding. She will:  -  DECREASE HCTZ to half tablet (12.5 mg) daily -  STOP Potassium supplement -  CONTINUE current dose of Spironolactone 12.5 mg daily -  Repeat BMET in 1 week  Labs have been scheduled in our office 10/29/21 at 11 AM.  Med list has been updated.  Pt has no further questions at this time.

## 2021-10-23 NOTE — Telephone Encounter (Signed)
-----   Message from [name], MD sent at 10/23/2021  6:40 AM EST ----- Please let Ms. Clippard know that her kidney function and potassium are remain normal.  Her sodium is a little low, which has been seen before.  I recommend that she cut her HCTZ in half to 12.5 mg daily and stop her potassium supplement.  She should continue current dose of spironolactone and have a repeat BMP in 1 week.

## 2021-10-29 ENCOUNTER — Other Ambulatory Visit (INDEPENDENT_AMBULATORY_CARE_PROVIDER_SITE_OTHER): Payer: Medicare Other

## 2021-10-29 ENCOUNTER — Other Ambulatory Visit: Payer: Self-pay

## 2021-10-29 DIAGNOSIS — Z79899 Other long term (current) drug therapy: Secondary | ICD-10-CM

## 2021-10-29 DIAGNOSIS — I10 Essential (primary) hypertension: Secondary | ICD-10-CM | POA: Diagnosis not present

## 2021-10-30 LAB — BASIC METABOLIC PANEL
BUN/Creatinine Ratio: 21 (ref 12–28)
BUN: 16 mg/dL (ref 8–27)
CO2: 23 mmol/L (ref 20–29)
Calcium: 10.4 mg/dL — ABNORMAL HIGH (ref 8.7–10.3)
Chloride: 92 mmol/L — ABNORMAL LOW (ref 96–106)
Creatinine, Ser: 0.78 mg/dL (ref 0.57–1.00)
Glucose: 103 mg/dL — ABNORMAL HIGH (ref 70–99)
Potassium: 4.4 mmol/L (ref 3.5–5.2)
Sodium: 130 mmol/L — ABNORMAL LOW (ref 134–144)
eGFR: 74 mL/min/{1.73_m2} (ref >59)

## 2021-10-31 ENCOUNTER — Telehealth: Payer: Self-pay | Admitting: *Deleted

## 2021-10-31 MED ORDER — HYDROCHLOROTHIAZIDE 25 MG PO TABS: 25.0000 mg | ORAL_TABLET | Freq: Every day | ORAL | Status: DC

## 2021-10-31 NOTE — Telephone Encounter (Signed)
Spoke to the pt.  Notified of lab results and Dr. Darnelle Bos recc.  Pt voiced understanding. She will STOP Spironolactone.  She will INCREASE Hydrochlorothiazide back to 25 mg daily.  Pt will continue to monitor BP and will follow up as scheduled 11/22/21.  Pt has no further questions at this time.

## 2021-10-31 NOTE — Telephone Encounter (Signed)
-----   Message from [name], MD sent at 10/30/2021  4:26 PM EST ----- Please let Susan Ayala know that her sodium has continued to drop.  I worry this may be related to recent addition of spironolactone.  At this point, I recommend stopping spironolactone and increasing hydrochlorothiazide back to 25 mg daily.  She should continue to monitor her blood pressure and follow-up with [place] as previously arranged.

## 2021-11-13 ENCOUNTER — Other Ambulatory Visit: Payer: Medicare Other

## 2021-11-22 ENCOUNTER — Ambulatory Visit (INDEPENDENT_AMBULATORY_CARE_PROVIDER_SITE_OTHER): Payer: Medicare Other | Admitting: Internal Medicine

## 2021-11-22 ENCOUNTER — Encounter: Payer: Self-pay | Admitting: Internal Medicine

## 2021-11-22 ENCOUNTER — Other Ambulatory Visit
Admission: RE | Admit: 2021-11-22 | Discharge: 2021-11-22 | Disposition: A | Discharge status: Home / Self Care | Payer: Medicare Other | Attending: Internal Medicine | Admitting: Internal Medicine

## 2021-11-22 ENCOUNTER — Other Ambulatory Visit: Payer: Self-pay

## 2021-11-22 VITALS — BP 130/80 | HR 74 | Ht 65.0 in | Wt 160.0 lb

## 2021-11-22 DIAGNOSIS — Z79899 Other long term (current) drug therapy: Secondary | ICD-10-CM | POA: Diagnosis present

## 2021-11-22 DIAGNOSIS — I10 Essential (primary) hypertension: Secondary | ICD-10-CM | POA: Diagnosis present

## 2021-11-22 DIAGNOSIS — R0989 Other specified symptoms and signs involving the circulatory and respiratory systems: Secondary | ICD-10-CM

## 2021-11-22 DIAGNOSIS — E269 Hyperaldosteronism, unspecified: Secondary | ICD-10-CM

## 2021-11-22 LAB — BASIC METABOLIC PANEL
Anion gap: 9 (ref 5–15)
BUN: 22 mg/dL (ref 8–23)
CO2: 26 mmol/L (ref 22–32)
Calcium: 9.9 mg/dL (ref 8.9–10.3)
Chloride: 94 mmol/L — ABNORMAL LOW (ref 98–111)
Creatinine, Ser: 0.69 mg/dL (ref 0.44–1.00)
GFR, Estimated: >60 mL/min (ref >60)
Glucose, Bld: 100 mg/dL — ABNORMAL HIGH (ref 70–99)
Potassium: 3.9 mmol/L (ref 3.5–5.1)
Sodium: 129 mmol/L — ABNORMAL LOW (ref 135–145)

## 2021-11-22 MED ORDER — HYDROCHLOROTHIAZIDE 25 MG PO TABS: 25.0000 mg | ORAL_TABLET | Freq: Every day | ORAL | 0 refills | Status: DC

## 2021-11-22 NOTE — Patient Instructions (Signed)
Medication Instructions:   Your physician recommends that you continue on your current medications as directed. Please refer to the Current Medication list given to you today.  *If you need a refill on your cardiac medications before your next appointment, please call your pharmacy*   Lab Work:  BMET today  -  Please go to the [place] will check in at the front desk to the right as you walk into the atrium.   If you have labs (blood work) drawn today and your tests are completely normal, you will receive your results only by: [place] (if you have MyChart) OR A paper copy in the mail If you have any lab test that is abnormal or we need to change your treatment, we will call you to review the results.   Testing/Procedures:  None ordered   Follow-Up: At [facility], you and your health needs are our priority.  As part of our continuing mission to provide you with exceptional heart care, we have created designated Provider Care Teams.  These Care Teams include your primary Cardiologist (physician) and Advanced Practice Providers (APPs -  Physician Assistants and Nurse Practitioners) who all work together to provide you with the care you need, when you need it.  We recommend signing up for the patient portal called "MyChart".  Sign up information is provided on this After Visit Summary.  MyChart is used to connect with patients for Virtual Visits (Telemedicine).  Patients are able to view lab/test results, encounter notes, upcoming appointments, etc.  Non-urgent messages can be sent to your provider as well.   To learn more about what you can do with MyChart, go to [email or web address].    Your next appointment:   3 month(s)  The format for your next appointment:   In Person  Provider:   You may see Dr. Harrell Gave End or one of the following Advanced Practice Providers on your designated Care Team:   Murray Hodgkins, NP Christell Faith, PA-C Cadence  Kathlen Mody, [place]

## 2021-11-22 NOTE — Progress Notes (Signed)
Follow-up Outpatient Visit Date: 11/22/2021  Primary Care Provider: Toni Arthurs, NP [address] [place] [address]  Chief Complaint: Follow-up hypertension and hyperaldosteronism  HPI:  Susan Ayala is a 87 y.o. female with history of labile hypertension, hyperlipidemia, depression, and osteoarthritis, who presents for follow-up of hypertension.  I last saw her a month ago, at which time she was feeling fairly well with improved leg cramps.  Blood pressures were also better though still mildly elevated at times.  Prior testing suggested possible hyperaldosteronism.  We agreed to add spironolactone 12.5 mg daily, though she subsequently developed hyponatremia requiring discontinuation of spironolactone.  Endocrinology referral was also placed at our last visit.  Today, Ms. Stoltzfus reports that she has been feeling well.  She notes that her home blood pressures have been somewhat labile, typically mildly elevated in the mornings and better in the afternoons.  She is back on HCTZ 25 mg daily amlodipine 5 mg daily, metoprolol 50 mg daily, and olmesartan 20 mg daily.  She has not had any chest pain, shortness of breath, palpitations, lightheadedness, or edema.  She asks if endocrinology referral can be deferred.  --------------------------------------------------------------------------------------------------  Cardiovascular History & Procedures: Cardiovascular Problems: Labile hypertension with concern for hyperaldosteronism   Risk Factors: Hypertension, hyperlipidemia, and age greater than 79   Cath/PCI: None   CV Surgery: None   EP Procedures and Devices: None   Non-Invasive Evaluation(s): None  Recent CV Pertinent Labs: Lab Results  Component Value Date   INR 1.0 02/01/2021   INR 0.9 02/02/2014   K 4.4 10/29/2021   K 3.0 (L) 09/22/2014   BUN 16 10/29/2021   BUN 13 09/22/2014   CREATININE 0.78 10/29/2021   CREATININE 0.71 09/22/2014    Past medical and  surgical history were reviewed and updated in EPIC.  Current Meds  Medication Sig   acetaminophen (TYLENOL 8 HOUR ARTHRITIS PAIN) 650 MG CR tablet Take 1,300 mg by mouth daily.   amLODipine (NORVASC) 5 MG tablet Take 5 mg by mouth daily.   amoxicillin (AMOXIL) 500 MG capsule Take 2,000 mg by mouth as needed. PRIOR TO DENTAL PROCEDURES   calcium carbonate (TUMS - DOSED IN MG ELEMENTAL CALCIUM) 500 MG chewable tablet Chew 1 tablet by mouth as needed for indigestion or heartburn.   Cholecalciferol (VITAMIN D3) 1000 units CAPS Take 1,000 Units by mouth daily.   diclofenac Sodium (VOLTAREN) 1 % GEL Apply 1 application topically daily as needed (pain).   hydrochlorothiazide (HYDRODIURIL) 25 MG tablet Take 1 tablet (25 mg total) by mouth daily.   lovastatin (MEVACOR) 40 MG tablet Take 40 mg by mouth at bedtime.   meloxicam (MOBIC) 7.5 MG tablet Take 7.5 mg by mouth daily.   metoprolol succinate (TOPROL-XL) 50 MG 24 hr tablet Take 50 mg by mouth every evening.   olmesartan (BENICAR) 20 MG tablet Take 20 mg by mouth daily.   sertraline (ZOLOFT) 50 MG tablet Take 75 mg by mouth every morning.    Allergies: Codeine, Adhesive [tape], and Zolpidem  Social History   Tobacco Use   Smoking status: Never   Smokeless tobacco: Never  Vaping Use   Vaping Use: Never used  Substance Use Topics   Alcohol use: Not Currently    Comment: No drink in 3 year   Drug use: No    Family History  Problem Relation Age of Onset   Hypertension Mother    Colon cancer Father     Review of Systems: A 12-system review  of systems was performed and was negative except as noted in the HPI.  --------------------------------------------------------------------------------------------------  Physical Exam: BP 130/80 (BP Location: Left Arm, Patient Position: Sitting, Cuff Size: Normal)   Pulse 74   Ht 5\' 5"  (1.651 m)   Wt 160 lb (72.6 kg)   SpO2 98%   BMI 26.63 kg/m   General:  NAD. Neck: No JVD or  HJR. Lungs: Clear to auscultation bilaterally without wheezes or crackles. Heart: Regular rate and rhythm without murmurs, rubs, or gallops. Abdomen: Soft, nontender, nondistended. Extremities: No lower extremity edema.  Lab Results  Component Value Date   WBC 7.7 02/01/2021   HGB 11.3 (L) 02/01/2021   HCT 35.3 (L) 02/01/2021   MCV 88.9 02/01/2021   PLT 291 02/01/2021    Lab Results  Component Value Date   NA 130 (L) 10/29/2021   K 4.4 10/29/2021   CL 92 (L) 10/29/2021   CO2 23 10/29/2021   BUN 16 10/29/2021   CREATININE 0.78 10/29/2021   GLUCOSE 103 (H) 10/29/2021   ALT 14 02/01/2021   --------------------------------------------------------------------------------------------------  ASSESSMENT AND PLAN: Labile hypertension and hyperaldosteronism: Blood pressure today is upper normal.  Home readings have premade somewhat labile but overall are normal to mildly elevated.  She is no longer on spironolactone due to hyponatremia associated with this.  We will recheck a BMP today to ensure that her sodium has not decreased further.  Defer changes to her current regimen of amlodipine, metoprolol, olmesartan, and HCTZ.  Per the patient's request, we will defer endocrinology referral.  Follow-up: Return to clinic in 3 months.  [name], MD 11/22/2021 4:21 PM

## 2021-11-23 ENCOUNTER — Telehealth: Payer: Self-pay | Admitting: *Deleted

## 2021-11-23 DIAGNOSIS — E871 Hypo-osmolality and hyponatremia: Secondary | ICD-10-CM

## 2021-11-23 DIAGNOSIS — Z79899 Other long term (current) drug therapy: Secondary | ICD-10-CM

## 2021-11-23 DIAGNOSIS — I10 Essential (primary) hypertension: Secondary | ICD-10-CM

## 2021-11-23 MED ORDER — OLMESARTAN MEDOXOMIL 40 MG PO TABS: 40.0000 mg | ORAL_TABLET | Freq: Every day | ORAL | 2 refills | Status: DC

## 2021-11-23 NOTE — Telephone Encounter (Signed)
Spoke to pt. Notified of lab results and Dr. Darnelle Bos recc.  Pt voiced understanding.   Pt will: - Stop taking HCTZ.  - Increase Olmesartan 40 mg  - Repeat BMET in 2 weeks (Lab appt scheduled in our office 12/05/21) - Pt will continue to monitor BP and will let us know if it is consistently above 140/90 or if she has low readings (below 90/60).   New Rx sent to mail order pharmacy per pt preference.  Pt will double current dose for total of 40 mg of Olmesartan.  Lab orders placed.  Pt has no further questions at this time.

## 2021-11-23 NOTE — Telephone Encounter (Signed)
-----   Message from [name], MD sent at 11/23/2021  1:24 PM EST ----- Please let Ms. Kuennen know that her sodium is still a little low, similar to 3 weeks ago, despite stopping spironolactone.  I recommend that she stop taking HCTZ and increase olmesartan to 40 mg daily.  We should repeat a BMP in ~2 weeks.  Ms. Melder should continue to monitor her BP and let us know if it is consistently above 140/90 or if she has low readings (below 90/60).

## 2021-12-03 MED ORDER — AMLODIPINE BESYLATE 10 MG PO TABS: 10.0000 mg | ORAL_TABLET | Freq: Every day | ORAL | 3 refills | Status: DC

## 2021-12-03 NOTE — Telephone Encounter (Signed)
Called and spoke with Susan Ayala and advised her of Dr Darnelle Bos recommendations. Pt verbalized understanding.  Pt will: - Increase amlodipine to 10 mg daily - Come in on 12/21 for repeat lab work - Continue to monitor BP for the next several weeks. Pt advised it may take a few days for the effects of the increased amlodipine to be seen  Amlodipine 10 mg daily sent into pharmacy.   Pt voiced appreciation for call.

## 2021-12-03 NOTE — Telephone Encounter (Signed)
Please have Susan Ayala increase amlodipine to 10 mg daily and continue to follow her BP over the next few weeks.  It may take a few days before she begins to see the full effect of the increased dose of amlodipine.  [name], MD [facility]

## 2021-12-03 NOTE — Telephone Encounter (Signed)
Pt c/o BP issue: STAT if pt c/o blurred vision, one-sided weakness or slurred speech  1. What are your last 5 BP readings? Trending 140's-150's/70's -80's   2. Are you having any other symptoms (ex. Dizziness, headache, blurred vision, passed out)? No symptoms just concerned about numbers   3. What is your BP issue? Trending up since last med change

## 2021-12-03 NOTE — Telephone Encounter (Signed)
Called patient back.   - Patient stopped HCTZ and increased olmesartan on 12/10  - Pt reports that after that she started to have some labile blood pressures, but in the afternoon her BPs were in the 110s-120s/60s-70s for several days - After several days BP started to trend up to BPs consistently being 140s-150s/high 80s even in the afternoon for the last 2 days.  - Reports that this morning BP was 160s/80s  Pt denies any symptoms of headache, blurred vision, one sided-weakness. States that she just has it written down to notify the office if her BP started to stay above 140/90 consistently. No further questions or concerns.   Will forward message to Dr. Saunders Revel for review.

## 2021-12-03 NOTE — Addendum Note (Signed)
Addended by: Mila Merry on: 12/03/2021 01:32 PM   Modules accepted: Orders

## 2021-12-05 ENCOUNTER — Other Ambulatory Visit (INDEPENDENT_AMBULATORY_CARE_PROVIDER_SITE_OTHER): Payer: Medicare Other

## 2021-12-05 ENCOUNTER — Other Ambulatory Visit: Payer: Self-pay

## 2021-12-05 DIAGNOSIS — Z79899 Other long term (current) drug therapy: Secondary | ICD-10-CM

## 2021-12-05 DIAGNOSIS — E871 Hypo-osmolality and hyponatremia: Secondary | ICD-10-CM

## 2021-12-05 DIAGNOSIS — I10 Essential (primary) hypertension: Secondary | ICD-10-CM | POA: Diagnosis not present

## 2021-12-06 LAB — BASIC METABOLIC PANEL
BUN/Creatinine Ratio: 25 (ref 12–28)
BUN: 20 mg/dL (ref 8–27)
CO2: 23 mmol/L (ref 20–29)
Calcium: 9.9 mg/dL (ref 8.7–10.3)
Chloride: 101 mmol/L (ref 96–106)
Creatinine, Ser: 0.79 mg/dL (ref 0.57–1.00)
Glucose: 96 mg/dL (ref 70–99)
Potassium: 4.6 mmol/L (ref 3.5–5.2)
Sodium: 138 mmol/L (ref 134–144)
eGFR: 73 mL/min/{1.73_m2} (ref >59)

## 2022-02-09 ENCOUNTER — Other Ambulatory Visit: Payer: Self-pay | Admitting: Internal Medicine

## 2022-03-21 ENCOUNTER — Encounter: Payer: Self-pay | Admitting: Internal Medicine

## 2022-03-21 ENCOUNTER — Ambulatory Visit (INDEPENDENT_AMBULATORY_CARE_PROVIDER_SITE_OTHER): Payer: Medicare Other | Admitting: Internal Medicine

## 2022-03-21 VITALS — BP 120/70 | HR 74 | Ht 66.0 in | Wt 164.2 lb

## 2022-03-21 DIAGNOSIS — E269 Hyperaldosteronism, unspecified: Secondary | ICD-10-CM | POA: Diagnosis not present

## 2022-03-21 DIAGNOSIS — R0989 Other specified symptoms and signs involving the circulatory and respiratory systems: Secondary | ICD-10-CM

## 2022-03-21 DIAGNOSIS — R600 Localized edema: Secondary | ICD-10-CM

## 2022-03-21 MED ORDER — METOPROLOL SUCCINATE ER 50 MG PO TB24: 50.0000 mg | ORAL_TABLET | Freq: Every evening | ORAL | 2 refills | Status: DC

## 2022-03-21 MED ORDER — OLMESARTAN MEDOXOMIL 40 MG PO TABS
40.0000 mg | ORAL_TABLET | Freq: Every day | ORAL | 2 refills | Status: DC
Start: 2022-03-21 — End: 2022-12-02

## 2022-03-21 NOTE — Patient Instructions (Signed)
Medication Instructions:  ? ?Your physician recommends that you continue on your current medications as directed. Please refer to the Current Medication list given to you today. ? ?*If you need a refill on your cardiac medications before your next appointment, please call your pharmacy* ? ? ?Lab Work: ? ?None ordered ? ?Testing/Procedures: ? ?None ordered ? ? ?Follow-Up: ?At [facility], you and your health needs are our priority.  As part of our continuing mission to provide you with exceptional heart care, we have created designated Provider Care Teams.  These Care Teams include your primary Cardiologist (physician) and Advanced Practice Providers (APPs -  Physician Assistants and Nurse Practitioners) who all work together to provide you with the care you need, when you need it. ? ?We recommend signing up for the patient portal called "MyChart".  Sign up information is provided on this After Visit Summary.  MyChart is used to connect with patients for Virtual Visits (Telemedicine).  Patients are able to view lab/test results, encounter notes, upcoming appointments, etc.  Non-urgent messages can be sent to your provider as well.   ?To learn more about what you can do with MyChart, go to [email or web address].   ? ?Your next appointment:   ?6 month(s) ? ?The format for your next appointment:   ?In Person ? ?Provider:   ?You may see [name], MD or one of the following Advanced Practice Providers on your designated Care Team:   ?Murray Hodgkins, NP ?Christell Faith, PA-C ?Cadence Kathlen Mody, PA-C ? ? ?Other Instructions ? ?Continue using over-the-counter compression socks. If swelling increases or blood pressure elevated, let us know.  ? ?

## 2022-03-21 NOTE — Progress Notes (Signed)
? ?Follow-up Outpatient Visit ?Date: 03/21/2022 ? ?Primary Care Provider: ?Latanya Maudlin, NP ?[address] ?[place] [address] ? ?Chief Complaint: Follow-up hypertension ? ?HPI:  Susan Ayala is a 88 y.o. female with history of labile hypertension, hyperlipidemia, depression, and osteoarthritis, who presents for follow-up of hypertension and possible hyperaldosteronism.  I last saw her in 11/2021, at which time she was feeling well.  She was previously on spironolactone, though this had to be stopped due to hyponatremia.  We had referred her to endocrinology, though she subsequently asked to defer this consultation.  Blood pressure was upper normal at our last visit (130/80). ? ?Today, Susan Ayala reports that she has been doing fairly well though she notes swelling in her ankles, left greater than right, which has been more pronounced since her hip replacement last year.  It is most pronounced when she sits or stands for long periods of time.  Swelling is typically gone when she gets up in the morning.  She also has some abdominal fullness.  She denies chest pain, shortness of breath, palpitations, orthopnea, and PND.  She notes stable exertional dyspnea with strenuous activities and feels like she may have slowed down a little bit since last year, though she attributes some of that to recovery following her hip surgery.  Home blood pressures are typically similar to today's reading. ? ?-------------------------------------------------------------------------------------------------- ? ?Cardiovascular History & Procedures: ?Cardiovascular Problems: ?Labile hypertension with concern for hyperaldosteronism ?  ?Risk Factors: ?Hypertension, hyperlipidemia, and age greater than 64 ?  ?Cath/PCI: ?None ?  ?CV Surgery: ?None ?  ?EP Procedures and Devices: ?None ?  ?Non-Invasive Evaluation(s): ?Renal artery Doppler (10/04/2021): No evidence of renal artery stenosis in either kidney. ? ?Recent CV Pertinent  Labs: ?Lab Results  ?Component Value Date  ? INR 1.0 02/01/2021  ? INR 0.9 02/02/2014  ? K 4.6 12/05/2021  ? K 3.0 (L) 09/22/2014  ? BUN 20 12/05/2021  ? BUN 13 09/22/2014  ? CREATININE 0.79 12/05/2021  ? CREATININE 0.71 09/22/2014  ? ? ?Past medical and surgical history were reviewed and updated in EPIC. ? ?Current Meds  ?Medication Sig  ? acetaminophen (TYLENOL) 650 MG CR tablet Take 1,300 mg by mouth daily.  ? amLODipine (NORVASC) 10 MG tablet Take 1 tablet (10 mg total) by mouth daily.  ? calcium carbonate (TUMS - DOSED IN MG ELEMENTAL CALCIUM) 500 MG chewable tablet Chew 1 tablet by mouth as needed for indigestion or heartburn.  ? Cholecalciferol (VITAMIN D3) 1000 units CAPS Take 1,000 Units by mouth daily.  ? diclofenac Sodium (VOLTAREN) 1 % GEL Apply 1 application topically daily as needed (pain).  ? lovastatin (MEVACOR) 40 MG tablet Take 40 mg by mouth at bedtime.  ? meloxicam (MOBIC) 7.5 MG tablet Take 7.5 mg by mouth daily.  ? metoprolol succinate (TOPROL-XL) 50 MG 24 hr tablet Take 50 mg by mouth every evening.  ? olmesartan (BENICAR) 40 MG tablet Take 1 tablet (40 mg total) by mouth daily.  ? sertraline (ZOLOFT) 50 MG tablet Take 75 mg by mouth every morning.  ? vitamin B-12 (CYANOCOBALAMIN) 500 MCG tablet Take by mouth.  ? ? ?Allergies: Codeine, Adhesive [tape], and Zolpidem ? ?Social History  ? ?Tobacco Use  ? Smoking status: Never  ? Smokeless tobacco: Never  ?Vaping Use  ? Vaping Use: Never used  ?Substance Use Topics  ? Alcohol use: Not Currently  ?  Comment: No drink in 3 year  ? Drug use: No  ? ? ?Family  History  ?Problem Relation Age of Onset  ? Hypertension Mother   ? Colon cancer Father   ? ? ?Review of Systems: ?A 12-system review of systems was performed and was negative except as noted in the HPI. ? ?-------------------------------------------------------------------------------------------------- ? ?Physical Exam: ?BP 120/70 (BP Location: Left Arm, Patient Position: Sitting, Cuff Size:  Normal)   Pulse 74   Ht '5\' 6"'$  (1.676 m)   Wt 164 lb 4 oz (74.5 kg)   SpO2 98%   BMI 26.51 kg/m?  ? ?General:  NAD. ?Neck: No JVD or HJR. ?Lungs: Clear to auscultation bilaterally without wheezes or crackles. ?Heart: Regular rate and rhythm without murmurs, rubs, or gallops. ?Abdomen: Soft, nontender, nondistended. ?Extremities: Trace bilateral ankle edema, more pronounced on the left. ? ?EKG: Normal sinus rhythm without abnormality. ? ?Lab Results  ?Component Value Date  ? WBC 7.7 02/01/2021  ? HGB 11.3 (L) 02/01/2021  ? HCT 35.3 (L) 02/01/2021  ? MCV 88.9 02/01/2021  ? PLT 291 02/01/2021  ? ? ?Lab Results  ?Component Value Date  ? NA 138 12/05/2021  ? K 4.6 12/05/2021  ? CL 101 12/05/2021  ? CO2 23 12/05/2021  ? BUN 20 12/05/2021  ? CREATININE 0.79 12/05/2021  ? GLUCOSE 96 12/05/2021  ? ALT 14 02/01/2021  ? ? ?-------------------------------------------------------------------------------------------------- ? ?ASSESSMENT AND PLAN: ?Labile hypertension and hyperaldosteronism: ?Blood pressures well controlled today.  Previous work-up suggestive of hyperaldosteronism.  Susan Ayala did not tolerate spironolactone due to hyponatremia.  She declined endocrinology consultation.  Given that her blood pressure has been well controlled on her current regimen, we will defer medication changes and further work-up at this time. ? ?Leg edema: ?Most likely related to venous insufficiency.  No other signs or symptoms of heart failure.  We discussed further work-up options including lower extremity venous duplex and echocardiogram, as well as de-escalation of amlodipine.  However, we are concerned that her blood pressure may spike with decreased amlodipine dosing.  Susan Ayala will Ayala with more regular compression stocking use.  If edema worsens, we will pursue further work-up and medication changes.  Sodium restriction was encouraged. ? ?Follow-up: Return to clinic in 6 months. ? ?[name], MD ?03/21/2022 ?11:10 AM ? ?

## 2022-03-22 ENCOUNTER — Encounter: Payer: Self-pay | Admitting: Internal Medicine

## 2022-03-22 DIAGNOSIS — R0989 Other specified symptoms and signs involving the circulatory and respiratory systems: Secondary | ICD-10-CM | POA: Insufficient documentation

## 2022-09-18 ENCOUNTER — Ambulatory Visit: Payer: Medicare Other | Attending: Internal Medicine | Admitting: Internal Medicine

## 2022-09-18 ENCOUNTER — Encounter: Payer: Self-pay | Admitting: Internal Medicine

## 2022-09-18 VITALS — BP 120/72 | HR 73 | Ht 64.0 in | Wt 166.4 lb

## 2022-09-18 DIAGNOSIS — M79604 Pain in right leg: Secondary | ICD-10-CM | POA: Diagnosis not present

## 2022-09-18 DIAGNOSIS — R600 Localized edema: Secondary | ICD-10-CM | POA: Insufficient documentation

## 2022-09-18 DIAGNOSIS — I1A0 Resistant hypertension: Secondary | ICD-10-CM | POA: Insufficient documentation

## 2022-09-18 NOTE — Patient Instructions (Signed)
Medication Instructions:   Your physician recommends that you continue on your current medications as directed. Please refer to the Current Medication list given to you today.   *If you need a refill on your cardiac medications before your next appointment, please call your pharmacy*    Follow-Up: At [facility], you and your health needs are our priority.  As part of our continuing mission to provide you with exceptional heart care, we have created designated Provider Care Teams.  These Care Teams include your primary Cardiologist (physician) and Advanced Practice Providers (APPs -  Physician Assistants and Nurse Practitioners) who all work together to provide you with the care you need, when you need it.  We recommend signing up for the patient portal called "MyChart".  Sign up information is provided on this After Visit Summary.  MyChart is used to connect with patients for Virtual Visits (Telemedicine).  Patients are able to view lab/test results, encounter notes, upcoming appointments, etc.  Non-urgent messages can be sent to your provider as well.   To learn more about what you can do with MyChart, go to [email or web address].    Your next appointment:   6 month(s)  The format for your next appointment:   In Person  Provider:   You may see [name], MD or one of the following Advanced Practice Providers on your designated Care Team:   Murray Hodgkins, NP Christell Faith, PA-C Cadence Kathlen Mody, PA-C Gerrie Nordmann, NP    Other Instructions   Important Information About Sugar

## 2022-09-18 NOTE — Progress Notes (Signed)
Follow-up Outpatient Visit Date: 09/18/2022  Primary Care Provider: Latanya Maudlin, NP [place] [address]  Chief Complaint: Follow-up hypertension  HPI:  Susan Ayala is a 88 y.o. female with history of labile hypertension, hyperaldosteronism, hyperlipidemia, depression, and osteoarthritis, who presents for follow-up of hypertension.  I last saw her in April, at which time Susan Ayala was doing fairly well though she noted some asymmetric ankle swelling, worse on the left, that was exacerbated by her hip replacement last year.  She noted mild exertional dyspnea that was slightly worse compared to a year before.  Home blood pressure readings were typically normal.  She was previously intolerant of spironolactone due to hyponatremia.  She declined endocrinology consultation.  Given normal blood pressure at our visit and at home, we agreed to defer additional testing and intervention.  Today, Susan Ayala reports that she is feeling quite well.  Her only complaint is of continued intermittent ankle swelling, right greater than left, which is actually a little better than it our prior visit.  She intermittently uses compression stockings with good results.  She has been monitoring her home blood pressures, which have mostly been normal.  She has had only a few low readings.  She notes occasional transient lightheadedness.  She has not passed out or fallen.  She denies chest pain, shortness of breath, and palpitations.  She complains of some pain in the right calf just below the knee.  She wonders if it could be related to arthritis.  She describes it as a tightening that can be present when stretching the leg or when walking.  It improves with Voltaren gel.  --------------------------------------------------------------------------------------------------  Cardiovascular History & Procedures: Cardiovascular Problems: Labile hypertension with concern for hyperaldosteronism    Risk Factors: Hypertension, hyperlipidemia, and age greater than 54   Cath/PCI: None   CV Surgery: None   EP Procedures and Devices: None   Non-Invasive Evaluation(s): Renal artery Doppler (10/04/2021): No evidence of renal artery stenosis in either kidney.  Recent CV Pertinent Labs: Lab Results  Component Value Date   INR 1.0 02/01/2021   INR 0.9 02/02/2014   K 4.6 12/05/2021   K 3.0 (L) 09/22/2014   BUN 20 12/05/2021   BUN 13 09/22/2014   CREATININE 0.79 12/05/2021   CREATININE 0.71 09/22/2014    Past medical and surgical history were reviewed and updated in EPIC.  Current Meds  Medication Sig   acetaminophen (TYLENOL) 650 MG CR tablet Take 1,300 mg by mouth daily.   amLODipine (NORVASC) 10 MG tablet Take 1 tablet (10 mg total) by mouth daily.   amoxicillin (AMOXIL) 500 MG capsule Take 2,000 mg by mouth as needed. PRIOR TO DENTAL PROCEDURES   calcium carbonate (TUMS - DOSED IN MG ELEMENTAL CALCIUM) 500 MG chewable tablet Chew 1 tablet by mouth as needed for indigestion or heartburn.   Cholecalciferol (VITAMIN D3) 1000 units CAPS Take 1,000 Units by mouth daily.   diclofenac Sodium (VOLTAREN) 1 % GEL Apply 1 application topically daily as needed (pain).   lovastatin (MEVACOR) 40 MG tablet Take 40 mg by mouth at bedtime.   meloxicam (MOBIC) 7.5 MG tablet Take 7.5 mg by mouth daily as needed.   metoprolol succinate (TOPROL-XL) 50 MG 24 hr tablet Take 1 tablet (50 mg total) by mouth every evening.   olmesartan (BENICAR) 40 MG tablet Take 1 tablet (40 mg total) by mouth daily.   sertraline (ZOLOFT) 50 MG tablet Take 75 mg by mouth every morning.  Allergies: Codeine, Adhesive [tape], and Zolpidem  Social History   Tobacco Use   Smoking status: Never   Smokeless tobacco: Never  Vaping Use   Vaping Use: Never used  Substance Use Topics   Alcohol use: Not Currently    Comment: No drink in 3 year   Drug use: No    Family History  Problem Relation Age of Onset    Hypertension Mother    Colon cancer Father     Review of Systems: A 12-system review of systems was performed and was negative except as noted in the HPI.  --------------------------------------------------------------------------------------------------  Physical Exam: BP 120/72 (BP Location: Left Arm, Patient Position: Sitting, Cuff Size: Normal)   Pulse 73   Ht '5\' 4"'$  (1.626 m)   Wt 166 lb 6.4 oz (75.5 kg)   SpO2 96%   BMI 28.56 kg/m   General:  NAD. Neck: No JVD or HJR. Lungs: Clear to auscultation bilaterally without wheezes or crackles. Heart: Regular rate and rhythm without murmurs, rubs, or gallops. Abdomen: Soft, nontender, nondistended. Extremities: No lower extremity edema.  2+ posterior tibial and dorsalis pedis pulses bilaterally.  EKG: Normal sinus rhythm without abnormalities.  No change from prior tracing on 03/21/2022.  Lab Results  Component Value Date   WBC 7.7 02/01/2021   HGB 11.3 (L) 02/01/2021   HCT 35.3 (L) 02/01/2021   MCV 88.9 02/01/2021   PLT 291 02/01/2021    Lab Results  Component Value Date   NA 138 12/05/2021   K 4.6 12/05/2021   CL 101 12/05/2021   CO2 23 12/05/2021   BUN 20 12/05/2021   CREATININE 0.79 12/05/2021   GLUCOSE 96 12/05/2021   ALT 14 02/01/2021    --------------------------------------------------------------------------------------------------  ASSESSMENT AND PLAN: Hypertension: Blood pressure well controlled today.  Given episodic lightheadedness and a few borderline low blood pressure readings today combined with continued intermittent ankle edema, we discussed de-escalation of amlodipine.  However, as she has overall felt fairly well with good blood pressure control, Susan Ayala wishes to defer any changes at this time.  Leg edema: No swelling noted on exam today.  Overall, edema has been fairly well controlled with compression stockings.  We will defer medication changes at this time.  I encouraged Susan Ayala to  continue using her compression stockings as needed.  Leg pain: I suspect this is most likely arthritic.  We discussed potential for PAD, though my suspicion is relatively low given normal pedal pulses on examination today.  I offered to order ABIs, though Susan Ayala wishes to defer this.  She will try to follow-up with Dr. Marry Guan for reevaluation of her knee pain status post prior arthroscopic meniscus surgery.  Follow-up: Return to clinic in 6 months.  [name], MD 09/18/2022 10:01 AM

## 2022-11-29 ENCOUNTER — Other Ambulatory Visit: Payer: Self-pay | Admitting: Internal Medicine

## 2022-12-10 ENCOUNTER — Other Ambulatory Visit: Payer: Self-pay | Admitting: Internal Medicine

## 2023-01-30 ENCOUNTER — Other Ambulatory Visit: Payer: Self-pay | Admitting: Gerontology

## 2023-01-30 DIAGNOSIS — M8580 Other specified disorders of bone density and structure, unspecified site: Secondary | ICD-10-CM

## 2023-02-08 ENCOUNTER — Other Ambulatory Visit: Payer: Self-pay | Admitting: Internal Medicine

## 2023-03-19 ENCOUNTER — Ambulatory Visit: Payer: Medicare Other | Admitting: Internal Medicine

## 2023-03-26 ENCOUNTER — Ambulatory Visit: Payer: Medicare Other | Attending: Internal Medicine | Admitting: Internal Medicine

## 2023-03-26 ENCOUNTER — Encounter: Payer: Self-pay | Admitting: Internal Medicine

## 2023-03-26 ENCOUNTER — Other Ambulatory Visit
Admission: RE | Admit: 2023-03-26 | Discharge: 2023-03-26 | Disposition: A | Discharge status: Home / Self Care | Payer: Medicare Other | Attending: Internal Medicine | Admitting: Internal Medicine

## 2023-03-26 VITALS — BP 110/60 | HR 83 | Ht 65.0 in | Wt 168.1 lb

## 2023-03-26 DIAGNOSIS — Z79899 Other long term (current) drug therapy: Secondary | ICD-10-CM

## 2023-03-26 DIAGNOSIS — R0989 Other specified symptoms and signs involving the circulatory and respiratory systems: Secondary | ICD-10-CM | POA: Diagnosis not present

## 2023-03-26 DIAGNOSIS — R600 Localized edema: Secondary | ICD-10-CM | POA: Diagnosis present

## 2023-03-26 LAB — BASIC METABOLIC PANEL
Anion gap: 9 (ref 5–15)
BUN: 18 mg/dL (ref 8–23)
CO2: 24 mmol/L (ref 22–32)
Calcium: 9.7 mg/dL (ref 8.9–10.3)
Chloride: 104 mmol/L (ref 98–111)
Creatinine, Ser: 0.72 mg/dL (ref 0.44–1.00)
GFR, Estimated: >60 mL/min (ref >60)
Glucose, Bld: 108 mg/dL — ABNORMAL HIGH (ref 70–99)
Potassium: 3.9 mmol/L (ref 3.5–5.1)
Sodium: 137 mmol/L (ref 135–145)

## 2023-03-26 NOTE — Patient Instructions (Signed)
Medication Instructions:  Stop taking aspirin.  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   Testing/Procedures: No procedures ordered today.   Follow-Up: At [facility], you and your health needs are our priority.  As part of our continuing mission to provide you with exceptional heart care, we have created designated Provider Care Teams.  These Care Teams include your primary Cardiologist (physician) and Advanced Practice Providers (APPs -  Physician Assistants and Nurse Practitioners) who all work together to provide you with the care you need, when you need it.  We recommend signing up for the patient portal called "MyChart".  Sign up information is provided on this After Visit Summary.  MyChart is used to connect with patients for Virtual Visits (Telemedicine).  Patients are able to view lab/test results, encounter notes, upcoming appointments, etc.  Non-urgent messages can be sent to your provider as well.   To learn more about what you can do with MyChart, go to [email or web address].    Your next appointment:   6 month(s)  Provider:   You may see Yvonne Kendall, MD or one of the following Advanced Practice Providers on your designated Care Team:   Nicolasa Ducking, NP Eula Listen, PA-C Cadence Fransico Michael, PA-C Charlsie Quest, NP

## 2023-03-26 NOTE — Progress Notes (Signed)
Follow-up Outpatient Visit Date: 03/26/2023  Primary Care Provider: Luciana Axe, NP [address] [place] [address]  Chief Complaint: Follow-up labile hypertension and hyperaldosteronism  HPI:  Susan Ayala is a 89 y.o. female with history of labile hypertension, hyperaldosteronism, hyperlipidemia, depression, and osteoarthritis, who presents for follow-up of hypertension.  I last saw her in October, at which time she was feeling well.  Her only concern was continued intermittent ankle swelling, right greater than left, which was a little better than at prior visits.  Due to intermittent soft blood pressure and lightheadedness, we discussed de-escalation of amlodipine.  However, Susan Ayala wished to defer this.  BP generally well-controlled.  Today, Susan Ayala reports that she has continued to do well.  She denies chest pain, shortness of breath, palpitations, lightheadedness, or edema.  Home blood pressure readings have generally been well-controlled.  She is tolerating her medications well.  --------------------------------------------------------------------------------------------------  Cardiovascular History & Procedures: Cardiovascular Problems: Labile hypertension with concern for hyperaldosteronism   Risk Factors: Hypertension, hyperlipidemia, and age greater than 65   Cath/PCI: None   CV Surgery: None   EP Procedures and Devices: None   Non-Invasive Evaluation(s): Renal artery Doppler (10/04/2021): No evidence of renal artery stenosis in either kidney.  Recent CV Pertinent Labs: Lab Results  Component Value Date   INR 1.0 02/01/2021   INR 0.9 02/02/2014   K 4.6 12/05/2021   K 3.0 (L) 09/22/2014   BUN 20 12/05/2021   BUN 13 09/22/2014   CREATININE 0.79 12/05/2021   CREATININE 0.71 09/22/2014    Past medical and surgical history were reviewed and updated in EPIC.  Current Meds  Medication Sig   acetaminophen (TYLENOL) 650 MG CR tablet Take  1,300 mg by mouth daily.   amLODipine (NORVASC) 10 MG tablet TAKE 1 TABLET EVERY DAY   aspirin EC 81 MG tablet Take 81 mg by mouth daily. Swallow whole.   calcium carbonate (TUMS - DOSED IN MG ELEMENTAL CALCIUM) 500 MG chewable tablet Chew 1 tablet by mouth as needed for indigestion or heartburn.   Cholecalciferol (VITAMIN D3) 1000 units CAPS Take 1,000 Units by mouth daily.   diclofenac Sodium (VOLTAREN) 1 % GEL Apply 1 application topically daily as needed (pain).   lovastatin (MEVACOR) 40 MG tablet Take 40 mg by mouth at bedtime.   meloxicam (MOBIC) 7.5 MG tablet Take 7.5 mg by mouth daily as needed.   metoprolol succinate (TOPROL-XL) 50 MG 24 hr tablet TAKE 1 TABLET EVERY EVENING   olmesartan (BENICAR) 40 MG tablet TAKE 1 TABLET EVERY DAY   sertraline (ZOLOFT) 100 MG tablet Take 100 mg by mouth daily.    Allergies: Codeine, Adhesive [tape], Lisinopril, and Zolpidem  Social History   Tobacco Use   Smoking status: Never   Smokeless tobacco: Never  Vaping Use   Vaping Use: Never used  Substance Use Topics   Alcohol use: Not Currently    Comment: No drink in 3 year   Drug use: No    Family History  Problem Relation Age of Onset   Hypertension Mother    Colon cancer Father     Review of Systems: A 12-system review of systems was performed and was negative except as noted in the HPI.  --------------------------------------------------------------------------------------------------  Physical Exam: BP 110/60 (BP Location: Left Arm, Patient Position: Sitting, Cuff Size: Normal)   Pulse 83   Ht 5\' 5"  (1.651 m)   Wt 168 lb 2 oz (76.3 kg)   SpO2 96%  BMI 27.98 kg/m   General:  NAD.  Accompanied by her daughter. Neck: No JVD or HJR. Lungs: Clear to auscultation bilaterally without wheezes or crackles. Heart: Regular rate and rhythm without murmurs, rubs, or gallops. Abdomen: Soft, nontender, nondistended. Extremities: No lower extremity edema.  EKG: Normal sinus rhythm  without abnormalities.  Lab Results  Component Value Date   WBC 7.7 02/01/2021   HGB 11.3 (L) 02/01/2021   HCT 35.3 (L) 02/01/2021   MCV 88.9 02/01/2021   PLT 291 02/01/2021    Lab Results  Component Value Date   NA 138 12/05/2021   K 4.6 12/05/2021   CL 101 12/05/2021   CO2 23 12/05/2021   BUN 20 12/05/2021   CREATININE 0.79 12/05/2021   GLUCOSE 96 12/05/2021   ALT 14 02/01/2021    --------------------------------------------------------------------------------------------------  ASSESSMENT AND PLAN: Hypertension: Blood pressure well-controlled today and on most readings at home.  Continue current medications.  Check BMP today to ensure stable renal function and electrolytes.  Leg edema: Susan Ayala reports minimal swelling in her legs.  No edema is appreciated today.  No further workup/intervention recommended.  Follow-up: Return to clinic in 6 months.  Yvonne Kendall, MD 03/26/2023 11:11 AM

## 2023-09-25 ENCOUNTER — Encounter: Payer: Self-pay | Admitting: Internal Medicine

## 2023-09-25 ENCOUNTER — Ambulatory Visit: Payer: Medicare Other | Attending: Internal Medicine | Admitting: Internal Medicine

## 2023-09-25 VITALS — BP 120/68 | HR 78 | Ht 65.0 in | Wt 167.0 lb

## 2023-09-25 DIAGNOSIS — R0989 Other specified symptoms and signs involving the circulatory and respiratory systems: Secondary | ICD-10-CM | POA: Insufficient documentation

## 2023-09-25 DIAGNOSIS — Z79899 Other long term (current) drug therapy: Secondary | ICD-10-CM | POA: Insufficient documentation

## 2023-09-25 DIAGNOSIS — E785 Hyperlipidemia, unspecified: Secondary | ICD-10-CM | POA: Diagnosis not present

## 2023-09-25 DIAGNOSIS — M1990 Unspecified osteoarthritis, unspecified site: Secondary | ICD-10-CM | POA: Insufficient documentation

## 2023-09-25 NOTE — Patient Instructions (Signed)
Medication Instructions:  Your physician recommends that you continue on your current medications as directed. Please refer to the Current Medication list given to you today.   *If you need a refill on your cardiac medications before your next appointment, please call your pharmacy*   Lab Work: Your provider would like for you to have following labs drawn today (BMP).     Testing/Procedures: No test ordered today    Follow-Up: At [facility], you and your health needs are our priority.  As part of our continuing mission to provide you with exceptional heart care, we have created designated Provider Care Teams.  These Care Teams include your primary Cardiologist (physician) and Advanced Practice Providers (APPs -  Physician Assistants and Nurse Practitioners) who all work together to provide you with the care you need, when you need it.  We recommend signing up for the patient portal called "MyChart".  Sign up information is provided on this After Visit Summary.  MyChart is used to connect with patients for Virtual Visits (Telemedicine).  Patients are able to view lab/test results, encounter notes, upcoming appointments, etc.  Non-urgent messages can be sent to your provider as well.   To learn more about what you can do with MyChart, go to [email or web address].    Your next appointment:   1 year(s)  Provider:   You may see Yvonne Kendall, MD or one of the following Advanced Practice Providers on your designated Care Team:   Nicolasa Ducking, NP Eula Listen, PA-C Cadence Fransico Michael, PA-C Charlsie Quest, NP

## 2023-09-25 NOTE — Progress Notes (Signed)
  Cardiology Office Note:  .   Date:  09/25/2023  ID:  Susan Ayala, DOB [birth year], MRN [id number] PCP: Luciana Axe, NP  [facility] Providers Cardiologist:  Yvonne Kendall, MD     History of Present Illness: .   Susan Ayala is a 89 y.o. female with history of labile hypertension, hyperaldosteronism, hyperlipidemia, depression, and osteoarthritis, who presents for follow-up of hypertension.  I last saw her in April, at which time she was feeling well.  Home blood pressure readings were generally well-controlled.  Reading in the office that day was also excellent.  We agreed to defer medication changes and additional testing.  Today, Susan Ayala reports that she has been doing well.  BP's have been well-controlled.  She is tolerating her medications well without side-effects.  She has stable DOE when walking "a lot."  This has been stable for at least 6-12 months.  She denies chest pain and palpitations.  She has occasional orthostatic lightheadedness but has not fallen or passed out.  Intermittent depended edema is stable.  Susan Ayala has been dealing with arthritis and was stated on celecoxib in June.  This has helped quite a bit.  ROS: See HPI  Studies Reviewed: Marland Kitchen   EKG Interpretation Date/Time:  Thursday September 25 2023 09:23:23 EDT Ventricular Rate:  78 PR Interval:  182 QRS Duration:  90 QT Interval:  366 QTC Calculation: 417 R Axis:   -1  Text Interpretation: Normal sinus rhythm Low voltage QRS Borderline ECG When compared with ECG of 26-Mar-2023 No significant change was found Confirmed by Kathey Simer, Cristal Deer [phone]) on 09/25/2023 9:30:53 AM    Renal artery Doppler (10/04/2021): No evidence of renal artery stenosis in either kidney.   Risk Assessment/Calculations:        Physical Exam:   VS:  BP 120/68 (BP Location: Left Arm, Patient Position: Sitting, Cuff Size: Normal)   Pulse 78   Ht 5\' 5"  (1.651 m)   Wt 167 lb (75.8 kg)   SpO2 98%   BMI 27.79 kg/m     Wt Readings from Last 3 Encounters:  09/25/23 167 lb (75.8 kg)  03/26/23 168 lb 2 oz (76.3 kg)  09/18/22 166 lb 6.4 oz (75.5 kg)    General:  NAD. Neck: No JVD or HJR. Lungs: Clear to auscultation bilaterally without wheezes or crackles. Heart: Regular rate and rhythm without murmurs, rubs, or gallops. Abdomen: Soft, nontender, nondistended. Extremities: No lower extremity edema.  ASSESSMENT AND PLAN: .    Hypertension: Blood pressure remains well-controlled today.  We will defer medication changes.  Given addition of celecoxib 4 months ago, we will repeat a BMP to ensure stable renal function and electrolytes.  Arthritis: Leg pain has improved with addition of celecoxib.  We discussed the potential side-effects of NSAID's such as celecoxib, including renal dysfunction, hypertension, and cardiovascular events.  We will check a BMP today with plans to continue the medication given significant symptomatic improvement.    Dispo: Return to clinic in 1 year.  Signed, Yvonne Kendall, MD

## 2023-09-26 LAB — BASIC METABOLIC PANEL
BUN/Creatinine Ratio: 24 (ref 12–28)
BUN: 20 mg/dL (ref 8–27)
CO2: 20 mmol/L (ref 20–29)
Calcium: 10 mg/dL (ref 8.7–10.3)
Chloride: 100 mmol/L (ref 96–106)
Creatinine, Ser: 0.82 mg/dL (ref 0.57–1.00)
Glucose: 92 mg/dL (ref 70–99)
Potassium: 4.4 mmol/L (ref 3.5–5.2)
Sodium: 140 mmol/L (ref 134–144)
eGFR: 69 mL/min/{1.73_m2} (ref >59)

## 2023-10-10 ENCOUNTER — Other Ambulatory Visit: Payer: Self-pay | Admitting: Internal Medicine

## 2023-11-26 ENCOUNTER — Other Ambulatory Visit: Payer: Self-pay | Admitting: Internal Medicine

## 2024-09-24 ENCOUNTER — Encounter: Payer: Self-pay | Admitting: Internal Medicine

## 2024-09-24 ENCOUNTER — Ambulatory Visit: Attending: Internal Medicine | Admitting: Internal Medicine

## 2024-09-24 VITALS — BP 126/70 | HR 77 | Ht 64.0 in | Wt 164.7 lb

## 2024-09-24 DIAGNOSIS — E7800 Pure hypercholesterolemia, unspecified: Secondary | ICD-10-CM | POA: Diagnosis not present

## 2024-09-24 DIAGNOSIS — R0989 Other specified symptoms and signs involving the circulatory and respiratory systems: Secondary | ICD-10-CM | POA: Diagnosis present

## 2024-09-24 NOTE — Patient Instructions (Signed)

## 2024-09-24 NOTE — Progress Notes (Signed)
  Cardiology Office Note:  .   Date:  09/24/2024  ID:  Susan Ayala Mayer, DOB [birth year], MRN [id number] PCP: Steva Clotilda DEL, NP  [facility] Providers Cardiologist:  Lonni Hanson, MD     History of Present Illness: .   Susan Ayala is a 89 y.o. female with history of labile hypertension, hyperaldosteronism, hyperlipidemia, depression, and osteoarthritis, who presents for follow-up of hypertension.  I last saw her a year ago, at which time she was doing well with reasonable blood pressure control.  We did not make any medication changes.  Today, [name] reports that she has continued to feel very well.  She has mild exertional dyspnea when she walks quickly or is aggravated.  This has been stable for at least a year.  She feels like her chronic intermittent dependent edema is better than last year.  She denies chest pain and palpitations.  She has sporadic orthostatic lightheadedness if she sits or stands up quickly but has not passed out or fallen.  She is tolerating her medications well and notes that her home blood pressures have been well-controlled.  ROS: See HPI  Studies Reviewed: SABRA   EKG Interpretation Date/Time:  Friday September 24 2024 09:49:32 EDT Ventricular Rate:  77 PR Interval:  170 QRS Duration:  84 QT Interval:  364 QTC Calculation: 411 R Axis:   -7  Text Interpretation: Normal sinus rhythm Normal ECG When compared with ECG of 25-Sep-2023 09:23, No significant change was found Confirmed by Rhys Lichty, Lonni [phone]) on 09/24/2024 3:53:43 PM    Risk Assessment/Calculations:             Physical Exam:   VS:  BP 126/70   Pulse 77   Ht 5' 4 (1.626 m)   Wt 164 lb 11.2 oz (74.7 kg)   SpO2 98%   BMI 28.27 kg/m    Wt Readings from Last 3 Encounters:  09/24/24 164 lb 11.2 oz (74.7 kg)  09/25/23 167 lb (75.8 kg)  03/26/23 168 lb 2 oz (76.3 kg)    General:  NAD.  Accompanied by her daughter. Neck: No JVD or HJR. Lungs: Clear to auscultation  bilaterally without wheezes or crackles. Heart: Regular rate and rhythm without murmurs, rubs, or gallops. Abdomen: Soft, nontender, nondistended. Extremities: No lower extremity edema.  ASSESSMENT AND PLAN: .    Labile hypertension: Blood pressure has been well-controlled over the last year by home measurements and again at today's office visit.  Can continue current regimen of amlodipine , metoprolol  succinate, and olmesartan .  Labs in 05/2024 showed normal renal function and electrolytes.  Hyperlipidemia: LDL moderately elevated on last check in 05/2024.  Continue lovastatin under the direction of Ms. Coward.    Dispo: Return to clinic in 1 year.  Signed, Lonni Hanson, MD
